# Patient Record
Sex: Female | Born: 1985
Health system: Southern US, Community
[De-identification: ages and names within clinical notes are randomized; demographics above are authoritative.]

## PROBLEM LIST (undated history)

## (undated) ENCOUNTER — Inpatient Hospital Stay (HOSPITAL_COMMUNITY): Payer: Self-pay

## (undated) DIAGNOSIS — Z889 Allergy status to unspecified drugs, medicaments and biological substances status: Secondary | ICD-10-CM

## (undated) DIAGNOSIS — G43909 Migraine, unspecified, not intractable, without status migrainosus: Secondary | ICD-10-CM

## (undated) DIAGNOSIS — I10 Essential (primary) hypertension: Secondary | ICD-10-CM

## (undated) DIAGNOSIS — Z789 Other specified health status: Secondary | ICD-10-CM

## (undated) DIAGNOSIS — F419 Anxiety disorder, unspecified: Secondary | ICD-10-CM

## (undated) DIAGNOSIS — R51 Headache: Secondary | ICD-10-CM

## (undated) HISTORY — DX: Allergy status to unspecified drugs, medicaments and biological substances: Z88.9

## (undated) HISTORY — DX: Anxiety disorder, unspecified: F41.9

## (undated) HISTORY — PX: NO PAST SURGERIES: SHX2092

## (undated) HISTORY — PX: COLONOSCOPY: SHX174

## (undated) HISTORY — PX: UPPER GI ENDOSCOPY: SHX6162

## (undated) HISTORY — DX: Headache: R51

---

## 2007-12-03 ENCOUNTER — Emergency Department (HOSPITAL_COMMUNITY): Admission: EM | Admit: 2007-12-03 | Discharge: 2007-12-03 | Payer: Self-pay | Admitting: Family Medicine

## 2007-12-11 ENCOUNTER — Emergency Department (HOSPITAL_COMMUNITY): Admission: EM | Admit: 2007-12-11 | Discharge: 2007-12-11 | Payer: Self-pay | Admitting: Emergency Medicine

## 2008-01-04 ENCOUNTER — Emergency Department (HOSPITAL_COMMUNITY): Admission: EM | Admit: 2008-01-04 | Discharge: 2008-01-04 | Payer: Self-pay | Admitting: Family Medicine

## 2008-06-01 ENCOUNTER — Emergency Department (HOSPITAL_COMMUNITY): Admission: EM | Admit: 2008-06-01 | Discharge: 2008-06-01 | Payer: Self-pay | Admitting: Emergency Medicine

## 2009-06-20 ENCOUNTER — Emergency Department (HOSPITAL_COMMUNITY): Admission: EM | Admit: 2009-06-20 | Discharge: 2009-06-20 | Payer: Self-pay | Admitting: Emergency Medicine

## 2010-05-05 ENCOUNTER — Ambulatory Visit: Payer: Self-pay | Admitting: Family Medicine

## 2010-05-05 DIAGNOSIS — K2970 Gastritis, unspecified, without bleeding: Secondary | ICD-10-CM | POA: Insufficient documentation

## 2010-05-05 DIAGNOSIS — K2990 Gastroduodenitis, unspecified, without bleeding: Secondary | ICD-10-CM

## 2010-12-11 NOTE — Assessment & Plan Note (Signed)
Summary: UPSET STOMACH/EVM   Vital Signs:  Patient Profile:   25 Years Old Female CC:      Stomach Pain Height:     64 inches Weight:      181.5 pounds Temp:     97.7 degrees F oral Pulse rate:   64 / minute Pulse (ortho):   18 / minute BP sitting:   104 / 70  (left arm)  Pt. in pain?   yes    Location:   abdomen    Intensity:   7    Type:       aching  Vitals Entered By: Ma Hillock LPNj              Is Patient Diabetic? No  Does patient need assistance? Functional Status Self care Ambulation Normal      Current Allergies: No known allergies History of Present Illness Chief Complaint: Stomach Pain History of Present Illness: She ate acid-food and feels like this set off "gastritis" as she has had in past. She has had no ulcers. 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  She did not get relief from OTC meds.  REVIEW OF SYSTEMS Constitutional Symptoms      Denies fever, chills, night sweats, weight loss, weight gain, and fatigue.  Eyes       Denies change in vision, eye pain, eye discharge, glasses, contact lenses, and eye surgery. Ear/Nose/Throat/Mouth       Denies hearing loss/aids, change in hearing, ear pain, ear discharge, dizziness, frequent runny nose, frequent nose bleeds, sinus problems, sore throat, hoarseness, and tooth pain or bleeding.  Respiratory       Denies dry cough, productive cough, wheezing, shortness of breath, asthma, bronchitis, and emphysema/COPD.  Cardiovascular       Denies murmurs, chest pain, and tires easily with exhertion.    Gastrointestinal       Complains of stomach pain and indigestion.      Denies nausea/vomiting, diarrhea, constipation, and blood in bowel movements. Genitourniary       Denies painful urination, kidney stones, and loss of urinary control. Neurological       Denies paralysis, seizures, and fainting/blackouts. Musculoskeletal       Denies muscle pain, joint pain, joint stiffness, decreased range of motion, redness,  swelling, muscle weakness, and gout.  Skin       Denies bruising, unusual mles/lumps or sores, and hair/skin or nail changes.  Psych       Denies mood changes, temper/anger issues, anxiety/stress, speech problems, depression, and sleep problems.  Family History: Father: Healthy Mother: Healthy Siblings: none  Social History: Marital Status:single Children: none Occupation: Consulting civil engineer Never Smoked Alcohol use-yes, rare Drug use-no Smoking Status:  never Drug Use:  no Physical Exam General appearance: well developed, well nourished, no acute distress Head: normocephalic, atraumatic Eyes: conjunctivae and lids normal Pupils: equal, round, reactive to light Oral/Pharynx: tongue normal, posterior pharynx without erythema or exudate Neck: neck supple,  trachea midline, no masses Chest/Lungs: no rales, wheezes, or rhonchi bilateral, breath sounds equal without effort Heart: regular rate and  rhythm, no murmur Abdomen: soft, non-tender without obvious organomegaly Extremities: normal extremities Neurological: grossly intact and non-focal Skin: no obvious rashes or lesions. Many tatooes MSE: oriented to time, place, and person Assessment New Problems: GASTRITIS (ICD-535.50)   Plan New Medications/Changes: PROTONIX 40 MG TBEC (PANTOPRAZOLE SODIUM) 1 po qd  #30 x 3, 05/05/2010, Kathrynn Running MD  New Orders: New Patient Level II [phone]  The  patient and/or caregiver has been counseled thoroughly with regard to medications prescribed including dosage, schedule, interactions, rationale for use, and possible side effects and they verbalize understanding.  Diagnoses and expected course of recovery discussed and will return if not improved as expected or if the condition worsens. Patient and/or caregiver verbalized understanding.  Prescriptions: PROTONIX 40 MG TBEC (PANTOPRAZOLE SODIUM) 1 po qd  #30 x 3   Entered and Authorized by:   Kathrynn Running MD   Signed by:   Kathrynn Running MD on  05/05/2010   Method used:   Electronically to        [facility]  [address] (retail)       [address], [address]       [place], [place]  [address]       Ph: [phone]       Fax: [phone]   RxID:   [id number]   Patient Instructions: 1)  Avoid acid foods, try to reduce colas.  Orders Added: 1)  New Patient Level II [99202]   The patient was informed that there is no on-call provider or services available at this clinic during off-hours (when the clinic is closed).  If the patient developed a problem or concern that required immediate attention, the patient was advised to go the the nearest available urgent care or emergency department for medical care.  The patient verbalized understanding.     It was clearly explained to the patient that this [facility] is not intended to be a primary care clinic.  The patient is always better served by the continuity of care and the provider/patient relationships developed with their dedicated primary care provider.  The patient was told to be sure to follow up as soon as possible with their primary care provider to discuss treatments received and to receive further examination and testing.  The patient verbalized understanding. The will f/u with PCP ASAP.   The risks, benefits and possible side effects were clearly explained and discussed with the patient.  The patient verbalized clear understanding.  The patient was given instructions to return if symptoms don't improve, worsen or new changes develop.  If it is not during clinic hours and the patient cannot get back to this clinic then the patient was told to seek medical care at an available urgent care or emergency department.  The patient verbalized understanding.    I have reviewed the above medical office visit documention, including diagnoses, history, medications, clinical lists, orders and plan of care.   Rodney Langton, MD, FAAFP  May 07, 2010

## 2011-02-16 LAB — POCT URINALYSIS DIP (DEVICE)
Glucose, UA: 250 mg/dL — AB
Ketones, ur: 15 mg/dL — AB
Nitrite: POSITIVE — AB
Protein, ur: >=300 mg/dL — AB
Specific Gravity, Urine: <=1.005 (ref 1.005–1.030)
Urobilinogen, UA: >=8 mg/dL (ref 0.0–1.0)
pH: 5 (ref 5.0–8.0)

## 2011-02-16 LAB — URINE CULTURE: Colony Count: >=100000

## 2011-02-16 LAB — POCT PREGNANCY, URINE: Preg Test, Ur: NEGATIVE

## 2011-07-18 ENCOUNTER — Inpatient Hospital Stay (HOSPITAL_COMMUNITY)
Admission: RE | Admit: 2011-07-18 | Discharge: 2011-07-18 | Disposition: A | Discharge status: Home / Self Care | Payer: 59 | Source: Ambulatory Visit | Attending: Family Medicine | Admitting: Family Medicine

## 2011-08-02 LAB — CULTURE, ROUTINE-ABSCESS

## 2011-08-09 LAB — POCT URINALYSIS DIP (DEVICE)
Bilirubin Urine: NEGATIVE
Glucose, UA: NEGATIVE
Ketones, ur: NEGATIVE
Nitrite: POSITIVE — AB
Operator id: 239701
Protein, ur: NEGATIVE
Specific Gravity, Urine: 1.01
Urobilinogen, UA: 0.2
pH: 6

## 2011-08-09 LAB — POCT PREGNANCY, URINE
Operator id: 239701
Preg Test, Ur: NEGATIVE

## 2012-05-14 ENCOUNTER — Emergency Department: Payer: Self-pay | Admitting: Emergency Medicine

## 2013-02-11 LAB — OB RESULTS CONSOLE RPR: RPR: NONREACTIVE

## 2013-03-01 LAB — OB RESULTS CONSOLE GC/CHLAMYDIA
Chlamydia: NEGATIVE
Gonorrhea: NEGATIVE

## 2013-04-04 ENCOUNTER — Inpatient Hospital Stay (HOSPITAL_COMMUNITY)
Admission: AD | Admit: 2013-04-04 | Discharge: 2013-04-04 | Disposition: A | Discharge status: Home / Self Care | Payer: Medicaid Other | Source: Ambulatory Visit | Attending: Obstetrics and Gynecology | Admitting: Obstetrics and Gynecology

## 2013-04-04 ENCOUNTER — Encounter (HOSPITAL_COMMUNITY): Payer: Self-pay | Admitting: Obstetrics and Gynecology

## 2013-04-04 DIAGNOSIS — O368130 Decreased fetal movements, third trimester, not applicable or unspecified: Secondary | ICD-10-CM

## 2013-04-04 DIAGNOSIS — O368190 Decreased fetal movements, unspecified trimester, not applicable or unspecified: Secondary | ICD-10-CM | POA: Insufficient documentation

## 2013-04-04 HISTORY — DX: Other specified health status: Z78.9

## 2013-04-04 NOTE — MAU Note (Signed)
[name] is here today with complaints of decreased fetal movement. She is 35w and has only felt 2 movements in the last 24 hours.

## 2013-04-27 ENCOUNTER — Encounter (HOSPITAL_COMMUNITY): Payer: Self-pay | Admitting: *Deleted

## 2013-04-27 ENCOUNTER — Telehealth (HOSPITAL_COMMUNITY): Payer: Self-pay | Admitting: *Deleted

## 2013-04-27 NOTE — Telephone Encounter (Signed)
Preadmission screen  

## 2013-04-29 ENCOUNTER — Other Ambulatory Visit: Payer: Self-pay | Admitting: Obstetrics and Gynecology

## 2013-04-29 NOTE — H&P (Addendum)
Admission History and Physical Exam for an Obstetrics Patient  [name] is a 27 y.o. female, G2P0010, at [redacted]w[redacted]d gestation, who presents for a primary cesarean section. She has been followed at the [facility] and Gynecology division of [facility].  Her pregnancy has been complicated by obesity (weight is 222 pounds closed).  The patient desires a primary cesarean delivery despite multiple health care providers who have all advised that she not. See history below.  OB History   Grav Para Term Preterm Abortions TAB SAB Ect Mult Living   2 0   1 1    0      Past Medical History  Diagnosis Date  . Medical history non-contributory   . Headache(784.0)   . H/O seasonal allergies     No prescriptions prior to admission    Past Surgical History  Procedure Laterality Date  . No past surgeries      No Known Allergies  Family History: family history is not on file.  Social History:  reports that she has never smoked. She does not have any smokeless tobacco history on file. She reports that she does not drink alcohol or use illicit drugs.  Review of systems: Normal pregnancy complaints.  Admission Physical Exam:    There is no weight on file to calculate BMI.  There were no vitals taken for this visit.  HEENT:                 Within normal limits Chest:                   Clear Heart:                    Regular rate and rhythm Abdomen:             Gravid and nontender Extremities:          Grossly normal Neurologic exam: Grossly normal Pelvic exam:         Cervix: closed  Prenatal labs: ABO, Rh:              O positive HBsAg:                   negative HIV:                         nonreactive GBS:                      positive Antibody:               negative Rubella:                 positive RPR:                    Nonreactive (04/03 0000)      Prenatal Transfer Tool  Maternal Diabetes: No Genetic Screening: late entry into  prenatal care Maternal Ultrasounds/Referrals: Normal Fetal Ultrasounds or other Referrals:  None Maternal Substance Abuse:  No Significant Maternal Medications:  None Significant Maternal Lab Results:  None Other Comments:  positive beta strep  Assessment:  [redacted]w[redacted]d gestation  Obesity  Positive beta strep  Late entry into prenatal care  Desires primary cesarean delivery  Plan:  The patient will undergo a primary low transverse cesarean section.  Multiple health care providers have tried to convince the patient not to have this done.  The risk associated with the procedure have been reviewed including anesthetic complications, bleeding, possible damage to the surrounding organs, infections, and the complications for future pregnancies which may include adhesion formation, uterine rupture, fetal damage, and abnormal placentation which may lead to hysterectomy.  The patient insists that we proceed with cesarean delivery.   Janine Limbo 04/29/2013, 8:21 PM

## 2013-04-30 ENCOUNTER — Inpatient Hospital Stay (HOSPITAL_COMMUNITY): Payer: BC Managed Care – PPO | Admitting: Anesthesiology

## 2013-04-30 ENCOUNTER — Encounter (HOSPITAL_COMMUNITY): Payer: Self-pay | Admitting: Anesthesiology

## 2013-04-30 ENCOUNTER — Inpatient Hospital Stay (HOSPITAL_COMMUNITY)
Admission: RE | Admit: 2013-04-30 | Discharge: 2013-05-03 | DRG: 371 | Disposition: A | Discharge status: Home / Self Care | Payer: BC Managed Care – PPO | Source: Ambulatory Visit | Attending: Obstetrics and Gynecology | Admitting: Obstetrics and Gynecology

## 2013-04-30 ENCOUNTER — Encounter (HOSPITAL_COMMUNITY)
Admission: RE | Disposition: A | Discharge status: Home / Self Care | Payer: Self-pay | Source: Ambulatory Visit | Attending: Obstetrics and Gynecology

## 2013-04-30 ENCOUNTER — Encounter (HOSPITAL_COMMUNITY): Payer: Self-pay | Admitting: Pharmacy Technician

## 2013-04-30 DIAGNOSIS — O99892 Other specified diseases and conditions complicating childbirth: Principal | ICD-10-CM | POA: Diagnosis present

## 2013-04-30 DIAGNOSIS — O9902 Anemia complicating childbirth: Secondary | ICD-10-CM | POA: Diagnosis present

## 2013-04-30 DIAGNOSIS — D6489 Other specified anemias: Secondary | ICD-10-CM | POA: Diagnosis present

## 2013-04-30 DIAGNOSIS — Z22330 Carrier of Group B streptococcus: Secondary | ICD-10-CM

## 2013-04-30 DIAGNOSIS — O99214 Obesity complicating childbirth: Secondary | ICD-10-CM | POA: Diagnosis present

## 2013-04-30 DIAGNOSIS — Z98891 History of uterine scar from previous surgery: Secondary | ICD-10-CM

## 2013-04-30 DIAGNOSIS — E669 Obesity, unspecified: Secondary | ICD-10-CM | POA: Diagnosis present

## 2013-04-30 LAB — TYPE AND SCREEN
ABO/RH(D): O POS
Antibody Screen: NEGATIVE

## 2013-04-30 LAB — CBC
HCT: 32.7 % — ABNORMAL LOW (ref 36.0–46.0)
Hemoglobin: 11.1 g/dL — ABNORMAL LOW (ref 12.0–15.0)
MCH: 29.1 pg (ref 26.0–34.0)
MCHC: 33.9 g/dL (ref 30.0–36.0)
MCV: 85.6 fL (ref 78.0–100.0)
Platelets: 215 10*3/uL (ref 150–400)
RBC: 3.82 MIL/uL — ABNORMAL LOW (ref 3.87–5.11)
RDW: 14.3 % (ref 11.5–15.5)
WBC: 8.7 10*3/uL (ref 4.0–10.5)

## 2013-04-30 LAB — SURGICAL PCR SCREEN
MRSA, PCR: NEGATIVE
Staphylococcus aureus: NEGATIVE

## 2013-04-30 LAB — RPR: RPR Ser Ql: NONREACTIVE

## 2013-04-30 LAB — ABO/RH: ABO/RH(D): O POS

## 2013-04-30 SURGERY — Surgical Case
Anesthesia: Spinal | Site: Abdomen | Wound class: Clean Contaminated

## 2013-04-30 MED ORDER — SODIUM CHLORIDE 0.9 % IJ SOLN: 3.0000 mL | INTRAMUSCULAR | Status: DC | PRN

## 2013-04-30 MED ORDER — OXYCODONE-ACETAMINOPHEN 5-325 MG PO TABS
1.0000 | ORAL_TABLET | ORAL | Status: DC | PRN
  Administered 2013-05-01: 1 via ORAL
  Administered 2013-05-02 (×3): 2 via ORAL
  Administered 2013-05-03 (×2): 1 via ORAL
  Filled 2013-04-30: qty 2
  Filled 2013-04-30 (×2): qty 1
  Filled 2013-04-30: qty 2
  Filled 2013-04-30: qty 1
  Filled 2013-04-30: qty 2
  Filled 2013-04-30: qty 1
  Filled 2013-04-30: qty 2

## 2013-04-30 MED ORDER — MEPERIDINE HCL 25 MG/ML IJ SOLN: 6.2500 mg | INTRAMUSCULAR | Status: DC | PRN

## 2013-04-30 MED ORDER — ONDANSETRON HCL 4 MG PO TABS: 4.0000 mg | ORAL_TABLET | ORAL | Status: DC | PRN

## 2013-04-30 MED ORDER — MORPHINE SULFATE (PF) 0.5 MG/ML IJ SOLN
INTRAMUSCULAR | Status: DC | PRN
  Administered 2013-04-30: .2 mg via INTRATHECAL

## 2013-04-30 MED ORDER — SCOPOLAMINE 1 MG/3DAYS TD PT72
MEDICATED_PATCH | TRANSDERMAL | Status: AC
  Administered 2013-04-30: 1.5 mg via TRANSDERMAL
  Filled 2013-04-30: qty 1

## 2013-04-30 MED ORDER — PHENYLEPHRINE HCL 10 MG/ML IJ SOLN
INTRAMUSCULAR | Status: DC | PRN
  Administered 2013-04-30: 80 ug via INTRAVENOUS
  Administered 2013-04-30: 40 ug via INTRAVENOUS
  Administered 2013-04-30 (×4): 80 ug via INTRAVENOUS
  Administered 2013-04-30 (×3): 40 ug via INTRAVENOUS
  Administered 2013-04-30 (×2): 80 ug via INTRAVENOUS
  Administered 2013-04-30: 40 ug via INTRAVENOUS

## 2013-04-30 MED ORDER — CALCIUM CARBONATE ANTACID 500 MG PO CHEW: 2.0000 | CHEWABLE_TABLET | Freq: Every day | ORAL | Status: DC | PRN

## 2013-04-30 MED ORDER — SCOPOLAMINE 1 MG/3DAYS TD PT72: 1.0000 | MEDICATED_PATCH | Freq: Once | TRANSDERMAL | Status: DC

## 2013-04-30 MED ORDER — TETANUS-DIPHTH-ACELL PERTUSSIS 5-2.5-18.5 LF-MCG/0.5 IM SUSP
0.5000 mL | Freq: Once | INTRAMUSCULAR | Status: AC
  Administered 2013-05-01: 0.5 mL via INTRAMUSCULAR
  Filled 2013-04-30: qty 0.5

## 2013-04-30 MED ORDER — NALOXONE HCL 1 MG/ML IJ SOLN: 1.0000 ug/kg/h | INTRAVENOUS | Status: DC | PRN

## 2013-04-30 MED ORDER — NALBUPHINE HCL 10 MG/ML IJ SOLN: 5.0000 mg | INTRAMUSCULAR | Status: DC | PRN

## 2013-04-30 MED ORDER — MEASLES, MUMPS & RUBELLA VAC ~~LOC~~ INJ: 0.5000 mL | INJECTION | Freq: Once | SUBCUTANEOUS | Status: DC

## 2013-04-30 MED ORDER — PHENYLEPHRINE 40 MCG/ML (10ML) SYRINGE FOR IV PUSH (FOR BLOOD PRESSURE SUPPORT)
PREFILLED_SYRINGE | INTRAVENOUS | Status: AC
  Filled 2013-04-30: qty 20

## 2013-04-30 MED ORDER — DIBUCAINE 1 % RE OINT: 1.0000 "application " | TOPICAL_OINTMENT | RECTAL | Status: DC | PRN

## 2013-04-30 MED ORDER — DIPHENHYDRAMINE HCL 25 MG PO CAPS: 25.0000 mg | ORAL_CAPSULE | Freq: Four times a day (QID) | ORAL | Status: DC | PRN

## 2013-04-30 MED ORDER — KETOROLAC TROMETHAMINE 30 MG/ML IJ SOLN
15.0000 mg | Freq: Once | INTRAMUSCULAR | Status: DC | PRN
Start: 2013-04-30 — End: 2013-04-30

## 2013-04-30 MED ORDER — ZOLPIDEM TARTRATE 5 MG PO TABS: 5.0000 mg | ORAL_TABLET | Freq: Every evening | ORAL | Status: DC | PRN

## 2013-04-30 MED ORDER — ONDANSETRON HCL 4 MG/2ML IJ SOLN: 4.0000 mg | Freq: Three times a day (TID) | INTRAMUSCULAR | Status: DC | PRN

## 2013-04-30 MED ORDER — WITCH HAZEL-GLYCERIN EX PADS: 1.0000 "application " | MEDICATED_PAD | CUTANEOUS | Status: DC | PRN

## 2013-04-30 MED ORDER — PROMETHAZINE HCL 25 MG/ML IJ SOLN: 6.2500 mg | INTRAMUSCULAR | Status: DC | PRN

## 2013-04-30 MED ORDER — BUPIVACAINE-EPINEPHRINE (PF) 0.5% -1:200000 IJ SOLN
INTRAMUSCULAR | Status: AC
  Filled 2013-04-30: qty 10

## 2013-04-30 MED ORDER — FENTANYL CITRATE 0.05 MG/ML IJ SOLN
INTRAMUSCULAR | Status: AC
  Filled 2013-04-30: qty 2

## 2013-04-30 MED ORDER — MENTHOL 3 MG MT LOZG: 1.0000 | LOZENGE | OROMUCOSAL | Status: DC | PRN

## 2013-04-30 MED ORDER — OXYTOCIN 40 UNITS IN LACTATED RINGERS INFUSION - SIMPLE MED: 62.5000 mL/h | INTRAVENOUS | Status: AC

## 2013-04-30 MED ORDER — CEFAZOLIN SODIUM-DEXTROSE 2-3 GM-% IV SOLR
INTRAVENOUS | Status: AC
  Filled 2013-04-30: qty 50

## 2013-04-30 MED ORDER — LACTATED RINGERS IV SOLN
INTRAVENOUS | Status: DC | PRN
  Administered 2013-04-30 (×2): via INTRAVENOUS

## 2013-04-30 MED ORDER — METOCLOPRAMIDE HCL 5 MG/ML IJ SOLN: 10.0000 mg | Freq: Three times a day (TID) | INTRAMUSCULAR | Status: DC | PRN

## 2013-04-30 MED ORDER — PRENATAL MULTIVITAMIN CH
1.0000 | ORAL_TABLET | Freq: Every day | ORAL | Status: DC
  Administered 2013-05-01 – 2013-05-02 (×2): 1 via ORAL
  Filled 2013-04-30 (×2): qty 1

## 2013-04-30 MED ORDER — KETOROLAC TROMETHAMINE 60 MG/2ML IM SOLN: 60.0000 mg | Freq: Once | INTRAMUSCULAR | Status: AC | PRN

## 2013-04-30 MED ORDER — KETOROLAC TROMETHAMINE 60 MG/2ML IM SOLN
INTRAMUSCULAR | Status: AC
  Administered 2013-04-30: 60 mg via INTRAMUSCULAR
  Filled 2013-04-30: qty 2

## 2013-04-30 MED ORDER — BUPIVACAINE-EPINEPHRINE 0.5% -1:200000 IJ SOLN
INTRAMUSCULAR | Status: DC | PRN
  Administered 2013-04-30: 10 mL

## 2013-04-30 MED ORDER — OXYTOCIN 10 UNIT/ML IJ SOLN
INTRAMUSCULAR | Status: AC
  Filled 2013-04-30: qty 4

## 2013-04-30 MED ORDER — KETOROLAC TROMETHAMINE 30 MG/ML IJ SOLN: 30.0000 mg | Freq: Four times a day (QID) | INTRAMUSCULAR | Status: AC | PRN

## 2013-04-30 MED ORDER — FENTANYL CITRATE 0.05 MG/ML IJ SOLN
INTRAMUSCULAR | Status: DC | PRN
  Administered 2013-04-30: 12.5 ug via INTRATHECAL

## 2013-04-30 MED ORDER — MORPHINE SULFATE 0.5 MG/ML IJ SOLN
INTRAMUSCULAR | Status: AC
  Filled 2013-04-30: qty 10

## 2013-04-30 MED ORDER — HYDROMORPHONE HCL PF 1 MG/ML IJ SOLN: 0.2500 mg | INTRAMUSCULAR | Status: DC | PRN

## 2013-04-30 MED ORDER — ONDANSETRON HCL 4 MG/2ML IJ SOLN
INTRAMUSCULAR | Status: DC | PRN
  Administered 2013-04-30: 4 mg via INTRAVENOUS

## 2013-04-30 MED ORDER — LACTATED RINGERS IV SOLN
Freq: Once | INTRAVENOUS | Status: AC
  Administered 2013-04-30: 13:00:00 via INTRAVENOUS

## 2013-04-30 MED ORDER — ONDANSETRON HCL 4 MG/2ML IJ SOLN: 4.0000 mg | INTRAMUSCULAR | Status: DC | PRN

## 2013-04-30 MED ORDER — SIMETHICONE 80 MG PO CHEW: 80.0000 mg | CHEWABLE_TABLET | ORAL | Status: DC | PRN

## 2013-04-30 MED ORDER — OXYTOCIN 10 UNIT/ML IJ SOLN
40.0000 [IU] | INTRAVENOUS | Status: DC | PRN
  Administered 2013-04-30: 40 [IU] via INTRAVENOUS

## 2013-04-30 MED ORDER — ONDANSETRON HCL 4 MG/2ML IJ SOLN
INTRAMUSCULAR | Status: AC
  Filled 2013-04-30: qty 2

## 2013-04-30 MED ORDER — HYDROMORPHONE HCL PF 1 MG/ML IJ SOLN
INTRAMUSCULAR | Status: AC
  Administered 2013-04-30: 0.5 mg via INTRAVENOUS
  Filled 2013-04-30: qty 1

## 2013-04-30 MED ORDER — DIPHENHYDRAMINE HCL 50 MG/ML IJ SOLN: 25.0000 mg | INTRAMUSCULAR | Status: DC | PRN

## 2013-04-30 MED ORDER — DIPHENHYDRAMINE HCL 25 MG PO CAPS: 25.0000 mg | ORAL_CAPSULE | ORAL | Status: DC | PRN

## 2013-04-30 MED ORDER — IBUPROFEN 600 MG PO TABS
600.0000 mg | ORAL_TABLET | Freq: Four times a day (QID) | ORAL | Status: DC
  Administered 2013-05-01 – 2013-05-03 (×8): 600 mg via ORAL
  Filled 2013-04-30 (×9): qty 1

## 2013-04-30 MED ORDER — LANOLIN HYDROUS EX OINT: 1.0000 "application " | TOPICAL_OINTMENT | CUTANEOUS | Status: DC | PRN

## 2013-04-30 MED ORDER — PRENATAL MULTIVITAMIN CH: 1.0000 | ORAL_TABLET | Freq: Every day | ORAL | Status: DC

## 2013-04-30 MED ORDER — BUPIVACAINE IN DEXTROSE 0.75-8.25 % IT SOLN
INTRATHECAL | Status: DC | PRN
  Administered 2013-04-30: 1.4 mL via INTRATHECAL

## 2013-04-30 MED ORDER — DIPHENHYDRAMINE HCL 50 MG/ML IJ SOLN: 12.5000 mg | INTRAMUSCULAR | Status: DC | PRN

## 2013-04-30 MED ORDER — NALOXONE HCL 0.4 MG/ML IJ SOLN: 0.4000 mg | INTRAMUSCULAR | Status: DC | PRN

## 2013-04-30 MED ORDER — LACTATED RINGERS IV SOLN
INTRAVENOUS | Status: DC | PRN
  Administered 2013-04-30: 15:00:00 via INTRAVENOUS

## 2013-04-30 MED ORDER — SIMETHICONE 80 MG PO CHEW
80.0000 mg | CHEWABLE_TABLET | Freq: Three times a day (TID) | ORAL | Status: DC
  Administered 2013-04-30 – 2013-05-02 (×8): 80 mg via ORAL

## 2013-04-30 MED ORDER — MEDROXYPROGESTERONE ACETATE 150 MG/ML IM SUSP: 150.0000 mg | INTRAMUSCULAR | Status: DC | PRN

## 2013-04-30 MED ORDER — LACTATED RINGERS IV SOLN
INTRAVENOUS | Status: DC
  Administered 2013-05-01: 02:00:00 via INTRAVENOUS

## 2013-04-30 MED ORDER — CEFAZOLIN SODIUM-DEXTROSE 2-3 GM-% IV SOLR
2.0000 g | INTRAVENOUS | Status: AC
Start: 2013-04-30 — End: 2013-04-30
  Administered 2013-04-30: 2 g via INTRAVENOUS
  Filled 2013-04-30: qty 50

## 2013-04-30 MED ORDER — SENNOSIDES-DOCUSATE SODIUM 8.6-50 MG PO TABS
2.0000 | ORAL_TABLET | Freq: Every day | ORAL | Status: DC
  Administered 2013-04-30 – 2013-05-02 (×3): 2 via ORAL

## 2013-04-30 SURGICAL SUPPLY — 42 items
CLAMP CORD UMBIL (MISCELLANEOUS) IMPLANT
CLOTH BEACON ORANGE TIMEOUT ST (SAFETY) ×2 IMPLANT
CONTAINER PREFILL 10% NBF 15ML (MISCELLANEOUS) IMPLANT
DRAIN JACKSON PRT FLT 7MM (DRAIN) IMPLANT
DRAPE LG THREE QUARTER DISP (DRAPES) ×2 IMPLANT
DRESSING TELFA 8X3 (GAUZE/BANDAGES/DRESSINGS) ×2 IMPLANT
DRSG OPSITE POSTOP 4X10 (GAUZE/BANDAGES/DRESSINGS) ×2 IMPLANT
DURAPREP 26ML APPLICATOR (WOUND CARE) ×4 IMPLANT
ELECT REM PT RETURN 9FT ADLT (ELECTROSURGICAL) ×2
ELECTRODE REM PT RTRN 9FT ADLT (ELECTROSURGICAL) ×1 IMPLANT
EVACUATOR SILICONE 100CC (DRAIN) IMPLANT
EXTRACTOR VACUUM M CUP 4 TUBE (SUCTIONS) IMPLANT
GLOVE BIOGEL PI IND STRL 8.5 (GLOVE) ×1 IMPLANT
GLOVE BIOGEL PI INDICATOR 8.5 (GLOVE) ×1
GLOVE ECLIPSE 8.0 STRL XLNG CF (GLOVE) ×4 IMPLANT
GOWN PREVENTION PLUS XXLARGE (GOWN DISPOSABLE) ×2 IMPLANT
GOWN STRL REIN XL XLG (GOWN DISPOSABLE) ×4 IMPLANT
KIT ABG SYR 3ML LUER SLIP (SYRINGE) IMPLANT
NEEDLE HYPO 25X1 1.5 SAFETY (NEEDLE) ×2 IMPLANT
NEEDLE HYPO 25X5/8 SAFETYGLIDE (NEEDLE) IMPLANT
PACK C SECTION WH (CUSTOM PROCEDURE TRAY) ×2 IMPLANT
PAD ABD 7.5X8 STRL (GAUZE/BANDAGES/DRESSINGS) ×2 IMPLANT
PAD OB MATERNITY 4.3X12.25 (PERSONAL CARE ITEMS) ×2 IMPLANT
RINGERS IRRIG 1000ML POUR BTL (IV SOLUTION) ×2 IMPLANT
SPONGE GAUZE 4X4 12PLY (GAUZE/BANDAGES/DRESSINGS) ×2 IMPLANT
STAPLER VISISTAT 35W (STAPLE) IMPLANT
SUT MNCRL AB 3-0 PS2 27 (SUTURE) ×2 IMPLANT
SUT PLAIN 0 NONE (SUTURE) IMPLANT
SUT PLAIN 2 0 XLH (SUTURE) ×2 IMPLANT
SUT SILK 3 0 FS 1X18 (SUTURE) IMPLANT
SUT VIC AB 0 CT1 27 (SUTURE) ×2
SUT VIC AB 0 CT1 27XBRD ANBCTR (SUTURE) ×2 IMPLANT
SUT VIC AB 2-0 CTX 36 (SUTURE) ×4 IMPLANT
SUT VIC AB 3-0 CT1 27 (SUTURE)
SUT VIC AB 3-0 CT1 TAPERPNT 27 (SUTURE) IMPLANT
SUT VIC AB 3-0 SH 27 (SUTURE)
SUT VIC AB 3-0 SH 27X BRD (SUTURE) IMPLANT
SYR CONTROL 10ML LL (SYRINGE) ×2 IMPLANT
TAPE CLOTH SURG 4X10 WHT LF (GAUZE/BANDAGES/DRESSINGS) ×2 IMPLANT
TOWEL OR 17X24 6PK STRL BLUE (TOWEL DISPOSABLE) ×6 IMPLANT
TRAY FOLEY CATH 14FR (SET/KITS/TRAYS/PACK) ×2 IMPLANT
WATER STERILE IRR 1000ML POUR (IV SOLUTION) ×2 IMPLANT

## 2013-04-30 NOTE — Progress Notes (Signed)
Patient was set up with a double electric pump. She pumped for 10 minutes at 1830.

## 2013-04-30 NOTE — H&P (Signed)
The patient was interviewed and examined today.  The previously documented history and physical examination was reviewed. There are no changes. The operative procedure was reviewed. The risks and benefits were outlined again. The specific risks include, but are not limited to, anesthetic complications, bleeding, infections, and possible damage to the surrounding organs. The patient's questions were answered.  We are ready to proceed as outlined. The likelihood of the patient achieving the goals of this procedure is very likely.   CBC    Component Value Date/Time   WBC 8.7 04/30/2013 1220   RBC 3.82* 04/30/2013 1220   HGB 11.1* 04/30/2013 1220   HCT 32.7* 04/30/2013 1220   PLT 215 04/30/2013 1220   MCV 85.6 04/30/2013 1220   MCH 29.1 04/30/2013 1220   MCHC 33.9 04/30/2013 1220   RDW 14.3 04/30/2013 1220    BP 116/58  Pulse 73  Temp(Src) 97.9 F (36.6 C) (Oral)  Resp 18  Ht 5\' 4"  (1.626 m)  Wt 216 lb (97.977 kg)  BMI 37.06 kg/m2   Leonard Schwartz, M.D.

## 2013-04-30 NOTE — Anesthesia Preprocedure Evaluation (Signed)
Anesthesia Evaluation  Patient identified by MRN, date of birth, ID band Patient awake    Reviewed: Allergy & Precautions, H&P , NPO status , Patient's Chart, lab work & pertinent test results  Airway Mallampati: II TM Distance: >3 FB Neck ROM: full    Dental no notable dental hx.    Pulmonary neg pulmonary ROS,    Pulmonary exam normal       Cardiovascular negative cardio ROS      Neuro/Psych negative psych ROS   GI/Hepatic negative GI ROS, Neg liver ROS,   Endo/Other  negative endocrine ROS  Renal/GU negative Renal ROS  negative genitourinary   Musculoskeletal negative musculoskeletal ROS (+)   Abdominal Normal abdominal exam  (+)   Peds negative pediatric ROS (+)  Hematology negative hematology ROS (+)   Anesthesia Other Findings   Reproductive/Obstetrics (+) Pregnancy                           Anesthesia Physical Anesthesia Plan  ASA: II  Anesthesia Plan: Spinal   Post-op Pain Management:    Induction:   Airway Management Planned:   Additional Equipment:   Intra-op Plan:   Post-operative Plan:   Informed Consent: I have reviewed the patients History and Physical, chart, labs and discussed the procedure including the risks, benefits and alternatives for the proposed anesthesia with the patient or authorized representative who has indicated his/her understanding and acceptance.     Plan Discussed with: CRNA and Surgeon  Anesthesia Plan Comments:         Anesthesia Quick Evaluation  

## 2013-04-30 NOTE — Consult Note (Signed)
Neonatology Note:  Attendance at C-section:  I was asked by Dr. Stringer to attend this primary C/S at term at patient's request. The mother is a G2P0A1 O pos, GBS pos with obesity and late PNC. ROM at delivery, fluid clear. Infant vigorous with good spontaneous cry and tone. Needed only minimal bulb suctioning. Ap 9/9. Lungs clear to ausc in DR. To CN to care of Pediatrician.  Laynie Espy C. Elyce Zollinger, MD  

## 2013-04-30 NOTE — Anesthesia Procedure Notes (Signed)
Spinal  Patient location during procedure: OR Start time: 04/30/2013 2:19 PM End time: 04/30/2013 2:21 PM Preanesthetic Checklist Completed: patient identified, surgical consent, pre-op evaluation, timeout performed, IV checked, risks and benefits discussed and monitors and equipment checked Spinal Block Patient position: sitting Prep: DuraPrep Patient monitoring: heart rate, cardiac monitor, continuous pulse ox and blood pressure Approach: midline Location: L3-4 Injection technique: single-shot Needle Needle type: Pencan  Needle gauge: 24 G Needle length: 9 cm Needle insertion depth: 5 cm Assessment Sensory level: T4

## 2013-04-30 NOTE — Op Note (Addendum)
OPERATIVE NOTE  Patient's Name: [name]  Date of Birth: [birth year]  Medical Records Number: [id number]  Date of Operation: 04/30/2013  Preoperative diagnosis:  [redacted]w[redacted]d weeks gestation  Elective Cesarean Section  Obesity  Anemia  Postoperative diagnosis:  [redacted]w[redacted]d weeks gestation  Elective Cesarean Section  Obesity  Anemia  Procedure:  Primary low transverse cesarean section  Surgeon:  Leonard Schwartz, M.D.  Assistant:  Haroldine Laws, certified nurse midwife  Anesthesia:  Spinal  Disposition:  [name] is a 27 y.o. female, G2P1011, who presents at [redacted]w[redacted]d weeks gestation. The patient has been followed at the [facility] obstetrics and gynecology division of [facility] care for women. She has the above mentioned diagnosis. She elects cesarean delivery for no medical indication. She understands the indications for her procedure and she accepts the risk of, but not limited to, anesthetic complications, bleeding, infections, and possible damage to the surrounding organs.  Findings:  A  female Phineas Semen) was delivered from a OT position.  The Apgar scores were 9/9 . The uterus, fallopian tubes, and ovaries were normal for the gravid state.  Procedure:  The patient was taken to the operating room where a spinal anesthetic was given. The patient's abdomen was prepped with Chloraprep. The perineum was prepped with betadine. A Foley catheter was placed in the bladder. The patient was sterilely draped. A "timeout" was performed which properly identified the patient and the correct operative procedure. The lower abdomen was injected with half percent Marcaine with epinephrine. A low transverse incision was made in the abdomen and carried sharply through the subcutaneous tissue, the fascia, and the anterior peritoneum. An incision was made in the lower uterine segment. The incision was extended in a low transverse fashion. The membranes were ruptured.  The fetal head was delivered without difficulty. The mouth and nose were suctioned. The remainder of the infant was then delivered. The cord was clamped and cut. The infant was handed to the awaiting pediatric team. The placenta was removed. The uterine cavity was cleaned of amniotic fluid, clotted blood, and membranes. The uterine incision was closed using a running locking suture of 2-0 Vicryl. An imbricating suture of 2-0 Vicryl was placed. The pelvis was vigorously irrigated. Hemostasis was adequate. The anterior peritoneum and the abdominal musculature were closed using 2-0 Vicryl. The fascia was closed using a running suture of 0 Vicryl followed by 3 interrupted sutures of 0 Vicryl. The subcutaneous layer was closed using interrupted sutures of 2-0 Vicryl. The skin was reapproximated using a subcuticular suture of 3-0 Monocryl. Sponge, needle, and instrument counts were correct on 2 occasions. The estimated blood loss for the procedure was 800 cc. The patient tolerated her procedure well. She was transported to the recovery room in stable condition. The infant was taken to the full-term nursery in stable condition. The placenta was sent to labor and delivery.  Leonard Schwartz, M.D.

## 2013-04-30 NOTE — Transfer of Care (Signed)
Immediate Anesthesia Transfer of Care Note  Patient: [name]  Procedure(s) Performed: Procedure(s): CESAREAN SECTION (N/A)  Patient Location: PACU  Anesthesia Type:Spinal  Level of Consciousness: awake, alert  and oriented  Airway & Oxygen Therapy: Patient Spontanous Breathing  Post-op Assessment: Report given to PACU RN  Post vital signs: Reviewed  Complications: No apparent anesthesia complications

## 2013-04-30 NOTE — Anesthesia Postprocedure Evaluation (Signed)
Anesthesia Post Note  Patient: [name]  Procedure(s) Performed: Procedure(s) (LRB): CESAREAN SECTION (N/A)  Anesthesia type: Spinal  Patient location: PACU  Post pain: Pain level controlled  Post assessment: Post-op Vital signs reviewed  Last Vitals:  Filed Vitals:   04/30/13 1600  BP: 98/51  Pulse: 57  Temp:   Resp: 10    Post vital signs: Reviewed  Level of consciousness: awake  Complications: No apparent anesthesia complications

## 2013-05-01 LAB — CBC
HCT: 26.2 % — ABNORMAL LOW (ref 36.0–46.0)
Hemoglobin: 8.7 g/dL — ABNORMAL LOW (ref 12.0–15.0)
MCH: 28.9 pg (ref 26.0–34.0)
MCHC: 33.6 g/dL (ref 30.0–36.0)
MCV: 86.2 fL (ref 78.0–100.0)
Platelets: 168 10*3/uL (ref 150–400)
RBC: 3.04 MIL/uL — ABNORMAL LOW (ref 3.87–5.11)
RDW: 14.2 % (ref 11.5–15.5)
WBC: 9.8 10*3/uL (ref 4.0–10.5)

## 2013-05-01 MED ORDER — POLYSACCHARIDE IRON COMPLEX 150 MG PO CAPS
150.0000 mg | ORAL_CAPSULE | Freq: Two times a day (BID) | ORAL | Status: DC
  Administered 2013-05-01 – 2013-05-03 (×5): 150 mg via ORAL
  Filled 2013-05-01 (×7): qty 1

## 2013-05-01 NOTE — Anesthesia Postprocedure Evaluation (Signed)
  Anesthesia Post-op Note  Patient: [name]  Procedure(s) Performed: Procedure(s): CESAREAN SECTION (N/A)  Patient Location: Mother/Baby  Anesthesia Type:Spinal  Level of Consciousness: awake  Airway and Oxygen Therapy: Patient Spontanous Breathing  Post-op Pain: mild  Post-op Assessment: Patient's Cardiovascular Status Stable and Respiratory Function Stable  Post-op Vital Signs: stable  Complications: No apparent anesthesia complications

## 2013-05-01 NOTE — Progress Notes (Signed)
Patient urine output from 2015 to 0200 only 50cc of dark amber urine. LR with pitocin still running with about 40 cc remaining in bag. VSS, lochia scant, fundus firm. Patient not taking in much po fluids. Denny Levy CNM called. Okay to switch over to LR at 118ml/hr. Will continue to closely monitor patient.

## 2013-05-01 NOTE — Progress Notes (Addendum)
Subjective: Postpartum Day 1: Cesarean Delivery secondary to: elective at 39wks  Patient reports tolerating PO, + flatus and no problems voiding.  no complaints, up ad lib without syncope Pain well controlled with po meds BF started  Mood stable, bonding well Contraception: undecided at present   Objective: Vital signs in last 24 hours: Temp:  [97.4 F (36.3 C)-98.4 F (36.9 C)] 97.5 F (36.4 C) (06/21 1000) Pulse Rate:  [54-91] 66 (06/21 1002) Resp:  [10-25] 18 (06/21 1001) BP: (90-126)/(39-76) 126/64 mmHg (06/21 1002) SpO2:  [97 %-100 %] 98 % (06/21 1000) Weight:  [216 lb (97.977 kg)] 216 lb (97.977 kg) (06/20 1221)  Physical Exam:  General: alert and no distress Heart: RRR Lungs: CTAB Abdomen: BS x4 Uterine Fundus: firm Incision: pressure dressing remains on, CDI    Lochia: appropriate DVT Evaluation: No evidence of DVT seen on physical exam. Negative Homan's sign. Calf/Ankle edema is present.   Recent Labs  04/30/13 1220 05/01/13 0626  HGB 11.1* 8.7*  HCT 32.7* 26.2*    Assessment/Plan: Status post Cesarean section. Postoperative course complicated by mild anemia, with orthostatic changes  Will order FE supp BID  Continue current care.   Isidoro Santillana M 05/01/2013, 11:10 AM

## 2013-05-01 NOTE — Progress Notes (Signed)
Patient ID: [name], female   DOB: 19-Sep-1986, 27 y.o.   MRN: [id number]  Reviewed note by CNM.   UOP decreased overnight but improved with IVF.  Pt doing well.  Pt has minimal pain and does not want to take any pain medications.  Pt did take Ibuprofen.  Baby in NICU.  Stable and improving per pt.  She has not been informed on etiology of hypoglycemia.  Denies dizziness or SOB.  AFVSS UOP 500 cc/8 hrs Gen: NAD, comfortable.  Nursing baby. Abd:  Fundus firm, NT. Pressing dressing clean. Ext: no edema or calf tenderness. Labs: Hg 11.1 to 8.7  S/p primary c-section.  Doing well. Previously with decreased UOP but now adequate.  Po hydration, strict I/Os. Encouraged ambulation TID in halls. Routine postop care.

## 2013-05-02 NOTE — Clinical Social Work Note (Signed)
Clinical Social Work Department PSYCHOSOCIAL ASSESSMENT - MATERNAL/CHILD 05/02/2013  Patient:  [name],[name]  Account Number:  [id number]  Admit Date:  04/30/2013  Childs Name:   [name]    Clinical Social Worker:  Abdulkareem Badolato, LCSW   Date/Time:  05/02/2013 01:30 PM  Date Referred:  05/02/2013   Referral source  RN     Referred reason  NICU   Other referral source:   NICU Admission    I:  FAMILY / HOME ENVIRONMENT Child's legal guardian:  PARENT  Guardian - Name Guardian - Age Guardian - Address  [name] [address] [place], [place] [address]  [name]     Other household support members/support persons Name Relationship DOB  [name] FATHER    STEPMOTHER    Other support:   MOB reports good family support    II  PSYCHOSOCIAL DATA Information Source:  Patient Interview  Financial and Community Resources Employment:   Financial resources:  Medicaid If Medicaid - County:  [place] Other  WIC  Food Stamps   School / Grade:   Maternity Care Coordinator / Child Services Coordination / Early Interventions:  Cultural issues impacting care:    III  STRENGTHS Strengths  Home prepared for Child (including basic supplies)  Supportive family/friends  Adequate Resources  Compliance with medical plan  Understanding of illness   Strength comment:    IV  RISK FACTORS AND CURRENT PROBLEMS Current Problem:  None   Risk Factor & Current Problem Patient Issue Family Issue Risk Factor / Current Problem Comment   Nelson Nelson     V  SOCIAL WORK ASSESSMENT CSW met with MOB NICU with MOB's father.  CSW asked MOB if it was okay to consut with her for a moment and MOB said this was fine.  CSW discussed infant admission to NICU. MOB reports appropriate emotions around admission and feels she has good communication with doctors and nurses. CSW discussed with MOB any emotional concerns.  MOB reports no significant emotional concerns at this time.  CSW  discussed PPD. MOB reports knowing what symptoms to look out for. MOB reports this is her first baby and MOB is excited to be a parent.  CSW discussed supplies and family support.  MOB reports good family support.  MOB reports no concerns about supplies at this time.  No other social concerns at this time.  MOB was instructed to let RN or CSW know if any further concerns arise.  CSW continues to follow while infant in NICU.      VI SOCIAL WORK PLAN Social Work Plan  Psychosocial Support/Ongoing Assessment of Needs   Type of pt/family education:   PPD Symptoms   If child protective services report - county:   If child protective services report - date:   Information/referral to community resources comment:   Other social work plan:    

## 2013-05-02 NOTE — Progress Notes (Signed)
Subjective:  Postpartum Day 2: Cesarean Delivery  Patient reports tolerating PO.    Objective: Vital signs in last 24 hours: Temp:  [97.5 F (36.4 C)-98.7 F (37.1 C)] 98.6 F (37 C) (06/22 0620) Pulse Rate:  [66-85] 85 (06/22 0620) Resp:  [18] 18 (06/22 0620) BP: (99-126)/(52-70) 108/70 mmHg (06/22 0620) SpO2:  [98 %] 98 % (06/21 1000)  Physical Exam:  General: alert and no distress Lochia: appropriate Uterine Fundus: firm Incision: healing well DVT Evaluation: No evidence of DVT seen on physical exam.   Recent Labs  04/30/13 1220 05/01/13 0626  HGB 11.1* 8.7*  HCT 32.7* 26.2*    Assessment/Plan: Status post Cesarean section. Doing well postoperatively.  Continue current care. Her baby will be transferred from NICU to her room today. The plan is to go home tomorrow. She is undecided about circumcision at the moment. Wants an IUD for contraception.  Damilola Flamm V 05/02/2013, 8:57 AM

## 2013-05-03 ENCOUNTER — Encounter (HOSPITAL_COMMUNITY): Payer: Self-pay | Admitting: Obstetrics and Gynecology

## 2013-05-03 MED ORDER — OXYCODONE-ACETAMINOPHEN 5-325 MG PO TABS: 1.0000 | ORAL_TABLET | ORAL | Status: DC | PRN

## 2013-05-03 MED ORDER — IBUPROFEN 600 MG PO TABS: 600.0000 mg | ORAL_TABLET | Freq: Four times a day (QID) | ORAL | Status: DC | PRN

## 2013-05-03 NOTE — Lactation Note (Signed)
This note was copied from the chart of Pamela Niger. Lactation Consultation Note    Follow up consult with this mom of a term baby in NICU, with resolving hypoglycemia. Mom haas [facility] and I encouraged her to call and go to get a DEP today, after her hospital discharge. i reviewed hand expression with mom, and she was squirting mil, now at 68 hours post partum.   Mom has been breast feeding in the nICU, but has not been observed by a lactation consultant yet. I encouraged her to have the baby's nurse call for me the next time she breast feeds, especially prior to the baby's discharge to home.   Patient Name: [name] [id number] Date: 05/03/2013     Maternal Data    Feeding Feeding Type: Breast Milk with Formula added Feeding method: Bottle Nipple Type: Regular Length of feed: 10 min  LATCH Score/Interventions                      Lactation Tools Discussed/Used     Consult Status      [name] 05/03/2013, 2:15 PM

## 2013-05-03 NOTE — Discharge Summary (Signed)
  Cesarean Section Delivery Discharge Summary  [name]  DOB:    Mar 23, 1986 MRN:    [id number] CSN:    [id number]  Date of admission:                  04/30/13  Date of discharge:                   05/03/13  Procedures this admission:  Date of Delivery: 04/30/13  Newborn Data:  Live born female  Birth Weight: 7 lb 7.8 oz (3396 g) APGAR: 9, 9  Infant in NICU due to hypoglycemia, single episode of apnea  Circumcision Plan: Outpatient  History of Present Illness:  [name] is a 27 y.o. female, G2P1011, who presents at [redacted]w[redacted]d weeks gestation. The patient has been followed at the [facility] and Gynecology division of [facility].    Her pregnancy has been complicated by:  Patient Active Problem List   Diagnosis Date Noted  . Status post primary low transverse cesarean section 05/03/2013  . GASTRITIS 05/05/2010    Hospital course:  The patient was admitted for scheduled elective primary c/s per Dr. Stefano Gaul.   Her postpartum course was not complicated, other than her infant requiring admission to NICU on day 2 due to hypoglycemia, with a single episode of apnea occurring after NICU admission.  Patient did have anemia, but no orthostatic changes.  She declined transfusion.  She was discharged to home on postpartum day 3 doing well.  She was hoping to room in in NICU prior to baby's anticipated d/c on 05/05/13.  Feeding:  breast  Contraception:  IUD  Discharge hemoglobin:  Hemoglobin  Date Value Range Status  05/01/2013 8.7* 12.0 - 15.0 g/dL Final     REPEATED TO VERIFY     DELTA CHECK NOTED     HCT  Date Value Range Status  05/01/2013 26.2* 36.0 - 46.0 % Final    Discharge Physical Exam:   General: alert Lochia: appropriate Uterine Fundus: firm Incision: healing well DVT Evaluation: No evidence of DVT seen on physical exam. Negative Homan's sign.  Intrapartum Procedures: cesarean: low cervical,  transverse--primary elective Postpartum Procedures: none Complications-Operative and Postpartum: Anemia without hemodynamic compromise.  NICU infant  Discharge Diagnoses: Term Pregnancy-delivered  Discharge Information:  Activity:           Per [facility] handout Diet:                routine Medications: Ibuprofen, Iron and Percocet--Iron is OTC Condition:      stable Instructions:  refer to practice specific booklet Discharge to: home  Follow-up Information   Follow up with [facility]. Schedule an appointment as soon as possible for a visit in 5 weeks. (Call for any questions or concerns.)    Contact information:   [address] [place] [address] [phone]       [name] 05/03/2013

## 2013-05-04 LAB — RUBELLA SCREEN: Rubella: 0.66 Index (ref <0.90)

## 2013-05-06 ENCOUNTER — Inpatient Hospital Stay (HOSPITAL_COMMUNITY)
Admission: RE | Admit: 2013-05-06 | Payer: Medicaid Other | Source: Ambulatory Visit | Admitting: Obstetrics and Gynecology

## 2014-01-21 ENCOUNTER — Other Ambulatory Visit: Payer: Self-pay | Admitting: Family

## 2014-01-21 MED ORDER — NYSTATIN-TRIAMCINOLONE 100000-0.1 UNIT/GM-% EX OINT: 1.0000 "application " | TOPICAL_OINTMENT | Freq: Two times a day (BID) | CUTANEOUS | Status: DC

## 2014-03-06 ENCOUNTER — Encounter (HOSPITAL_COMMUNITY): Payer: Self-pay

## 2014-03-06 ENCOUNTER — Inpatient Hospital Stay (HOSPITAL_COMMUNITY)
Admission: AD | Admit: 2014-03-06 | Discharge: 2014-03-06 | Disposition: A | Discharge status: Home / Self Care | Payer: 59 | Source: Ambulatory Visit | Attending: Obstetrics and Gynecology | Admitting: Obstetrics and Gynecology

## 2014-03-06 DIAGNOSIS — Z711 Person with feared health complaint in whom no diagnosis is made: Secondary | ICD-10-CM | POA: Insufficient documentation

## 2014-03-06 LAB — POCT PREGNANCY, URINE: Preg Test, Ur: NEGATIVE

## 2014-03-06 NOTE — MAU Provider Note (Signed)
  History     CSN: [id number]  Arrival date and time: 03/06/14 1504  Provider here to evaluate patient@ 1620    Chief Complaint  Patient presents with  . Stuck Tampon    HPI  Thinks she has retained tampon in vagina since Friday- removed "pieces" but thinks some inside Unable to feel tampon  Moderate cramps on menses this cycle  Past Medical History  Diagnosis Date  . Medical history non-contributory   . Headache(784.0)   . H/O seasonal allergies     Past Surgical History  Procedure Laterality Date  . No past surgeries    . Cesarean section N/A 04/30/2013    Procedure: CESAREAN SECTION;  Surgeon: Kirkland HunArthur Stringer, MD;  Location: [facility];  Service: Obstetrics;  Laterality: N/A;    History reviewed. No pertinent family history.  History  Substance Use Topics  . Smoking status: Never Smoker   . Smokeless tobacco: Never Used  . Alcohol Use: No    Allergies:  Allergies  Allergen Reactions  . Percocet [Oxycodone-Acetaminophen] Itching    Prescriptions prior to admission  Medication Sig Dispense Refill  . fexofenadine-pseudoephedrine (ALLEGRA-D 24) 180-240 MG per 24 hr tablet Take 1 tablet by mouth as needed (allergies).        ROS  Physical Exam   Blood pressure 122/72, pulse 80, temperature 98.1 F (36.7 C), temperature source Oral, resp. rate 18, height 5\' 4"  (1.626 m), weight 92.25 kg (203 lb 6 oz), last menstrual period 03/03/2014, unknown if currently breastfeeding.  Physical Exam Abdomen soft and non-tender Spec exam - small menstrual blood - no odor No foreign body seen in vaginal vault VE no foreign felt in vaginal vault  MAU Course  Procedures   Assessment and Plan   questionable foreign body retained tampon - nothing present on exam  DC home  [name] 03/06/2014, 4:34 PM

## 2014-03-06 NOTE — Progress Notes (Signed)
Notified pt here for stuck tampon, no other issues. Will see pt in MAU.

## 2014-03-06 NOTE — MAU Note (Signed)
Pt states here for tampon stuck since Friday. Has attempted repeatedly and is only getting cotton fibers out. Denies any other issues

## 2014-04-21 ENCOUNTER — Other Ambulatory Visit: Payer: Self-pay | Admitting: Family

## 2014-04-21 MED ORDER — FLUCONAZOLE 150 MG PO TABS: 150.0000 mg | ORAL_TABLET | Freq: Once | ORAL | Status: DC

## 2014-06-13 ENCOUNTER — Emergency Department (HOSPITAL_BASED_OUTPATIENT_CLINIC_OR_DEPARTMENT_OTHER)
Admission: EM | Admit: 2014-06-13 | Discharge: 2014-06-13 | Disposition: A | Discharge status: Home / Self Care | Payer: 59 | Attending: Emergency Medicine | Admitting: Emergency Medicine

## 2014-06-13 ENCOUNTER — Encounter (HOSPITAL_BASED_OUTPATIENT_CLINIC_OR_DEPARTMENT_OTHER): Payer: Self-pay | Admitting: Emergency Medicine

## 2014-06-13 ENCOUNTER — Emergency Department (HOSPITAL_BASED_OUTPATIENT_CLINIC_OR_DEPARTMENT_OTHER): Payer: 59

## 2014-06-13 DIAGNOSIS — S3981XA Other specified injuries of abdomen, initial encounter: Secondary | ICD-10-CM | POA: Insufficient documentation

## 2014-06-13 DIAGNOSIS — Z3202 Encounter for pregnancy test, result negative: Secondary | ICD-10-CM | POA: Insufficient documentation

## 2014-06-13 DIAGNOSIS — S301XXA Contusion of abdominal wall, initial encounter: Secondary | ICD-10-CM | POA: Insufficient documentation

## 2014-06-13 LAB — URINALYSIS, ROUTINE W REFLEX MICROSCOPIC
Bilirubin Urine: NEGATIVE
Glucose, UA: NEGATIVE mg/dL
Hgb urine dipstick: NEGATIVE
Ketones, ur: NEGATIVE mg/dL
Leukocytes, UA: NEGATIVE
Nitrite: NEGATIVE
Protein, ur: NEGATIVE mg/dL
Specific Gravity, Urine: 1.027 (ref 1.005–1.030)
Urobilinogen, UA: 1 mg/dL (ref 0.0–1.0)
pH: 6 (ref 5.0–8.0)

## 2014-06-13 LAB — PREGNANCY, URINE: Preg Test, Ur: NEGATIVE

## 2014-06-13 MED ORDER — HYDROCODONE-ACETAMINOPHEN 5-325 MG PO TABS
1.0000 | ORAL_TABLET | Freq: Once | ORAL | Status: AC
  Administered 2014-06-13: 1 via ORAL
  Filled 2014-06-13: qty 1

## 2014-06-13 MED ORDER — ONDANSETRON 8 MG PO TBDP
8.0000 mg | ORAL_TABLET | Freq: Once | ORAL | Status: AC
  Administered 2014-06-13: 8 mg via ORAL
  Filled 2014-06-13: qty 1

## 2014-06-13 MED ORDER — TRAMADOL HCL 50 MG PO TABS: 50.0000 mg | ORAL_TABLET | Freq: Four times a day (QID) | ORAL | Status: DC | PRN
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
## 2014-06-13 NOTE — Discharge Instructions (Signed)
Blunt Abdominal Trauma °A blunt injury to the abdomen can cause pain. The pain is most likely from bruising and stretching of your muscles. This pain is often made worse with movement. Most often these injuries are not serious and get better within 1 week with rest and mild pain medicine. However, internal organs (liver, spleen, kidneys) can be injured with blunt trauma. If you do not get better or if you get worse, further examination may be needed. °Continue with your regular daily activities, but avoid any strenuous activities until your pain is improved. If your stomach is upset, stick to a clear liquid diet and slowly advance to solid food.  °SEEK IMMEDIATE MEDICAL CARE IF:  °· You develop increasing pain, nausea, or repeated vomiting. °· You develop chest pain or breathing difficulty. °· You develop blood in the urine, vomit, or stool. °· You develop weakness, fainting, fever, or other serious complaints. °Document Released: 12/05/2004 Document Revised: 01/20/2012 Document Reviewed: 03/23/2009 °ExitCare® Patient Information ©2015 [employer]. This information is not intended to replace advice given to you by your health care provider. Make sure you discuss any questions you have with your health care provider. ° °
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
## 2014-06-16 NOTE — ED Provider Notes (Signed)
Medical screening examination/treatment/procedure(s) were performed by non-physician practitioner and as supervising physician I was immediately available for consultation/collaboration.     Geoffery Lyonsouglas Blanche Gallien, MD 06/16/14 2134

## 2014-07-01 ENCOUNTER — Ambulatory Visit (INDEPENDENT_AMBULATORY_CARE_PROVIDER_SITE_OTHER): Payer: 59 | Admitting: Family

## 2014-07-01 ENCOUNTER — Encounter: Payer: Self-pay | Admitting: Family

## 2014-07-01 VITALS — BP 124/80 | HR 69 | Ht 64.0 in | Wt 190.0 lb

## 2014-07-01 DIAGNOSIS — G43009 Migraine without aura, not intractable, without status migrainosus: Secondary | ICD-10-CM | POA: Insufficient documentation

## 2014-07-01 DIAGNOSIS — G44209 Tension-type headache, unspecified, not intractable: Secondary | ICD-10-CM

## 2014-07-01 DIAGNOSIS — G43709 Chronic migraine without aura, not intractable, without status migrainosus: Secondary | ICD-10-CM

## 2014-07-01 MED ORDER — BUTALBITAL-APAP-CAFF-COD 50-325-40-30 MG PO CAPS: 1.0000 | ORAL_CAPSULE | Freq: Three times a day (TID) | ORAL | Status: DC | PRN

## 2014-07-01 MED ORDER — AMITRIPTYLINE HCL 10 MG PO TABS: 10.0000 mg | ORAL_TABLET | Freq: Every day | ORAL | Status: DC

## 2014-07-01 NOTE — Progress Notes (Signed)
Pre visit review using our clinic review tool, if applicable. No additional management support is needed unless otherwise documented below in the visit note. 

## 2014-07-01 NOTE — Progress Notes (Signed)
Subjective:    Patient ID: [name], female    DOB: [birth year], 28 y.o.   MRN: [id number]  HPI 28 year old AAF, nonsmoker, is in today to be established. 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 Reports having headaches on a daily basis. Has a history of migraine headaches in the past. Has tried Excedrin, BC, and tramadol without relief. Headaches last 4-5/10, occuring at the temples. Reports occasional nausea but no vomiting. No sensitivity to light or noise.    Review of Systems  Constitutional: Negative.   Respiratory: Negative.   Cardiovascular: Negative.   Gastrointestinal: Negative.   Endocrine: Negative.   Genitourinary: Negative.   Musculoskeletal: Negative.   Skin: Negative.   Neurological: Positive for headaches. Negative for dizziness.  Hematological: Negative.   Psychiatric/Behavioral: Positive for sleep disturbance.   Past Medical History  Diagnosis Date  . Medical history non-contributory   . Headache(784.0)   . H/O seasonal allergies     History   Social History  . Marital Status: Single    Spouse Name: N/A    Number of Children: N/A  . Years of Education: N/A   Occupational History  . Not on file.   Social History Main Topics  . Smoking status: Never Smoker   . Smokeless tobacco: Never Used  . Alcohol Use: Yes     Comment: occ  . Drug Use: No  . Sexual Activity: Not Currently   Other Topics Concern  . Not on file   Social History Narrative  . No narrative on file    Past Surgical History  Procedure Laterality Date  . No past surgeries    . Cesarean section N/A 04/30/2013    Procedure: CESAREAN SECTION;  Surgeon: Kirkland Hun, MD;  Location: [facility];  Service: Obstetrics;  Laterality: N/A;    History reviewed. No pertinent family history.  Allergies  Allergen Reactions  . Percocet [Oxycodone-Acetaminophen] Itching    Current Outpatient Prescriptions on File Prior to Visit  Medication  Sig Dispense Refill  . fexofenadine-pseudoephedrine (ALLEGRA-D 24) 180-240 MG per 24 hr tablet Take 1 tablet by mouth as needed (allergies).       No current facility-administered medications on file prior to visit.    BP 124/80  Pulse 69  Ht 5\' 4"  (1.626 m)  Wt 190 lb (86.183 kg)  BMI 32.60 kg/m2  LMP 07/20/2015chart     Objective:   Physical Exam  Constitutional: She is oriented to person, place, and time. She appears well-developed and well-nourished.  HENT:  Right Ear: External ear normal.  Left Ear: External ear normal.  Nose: Nose normal.  Mouth/Throat: Oropharynx is clear and moist.  Neck: Normal range of motion. Neck supple. No thyromegaly present.  Cardiovascular: Normal rate, regular rhythm and normal heart sounds.   Pulmonary/Chest: Effort normal and breath sounds normal.  Abdominal: Soft. Bowel sounds are normal.  Musculoskeletal: Normal range of motion.  Neurological: She is alert and oriented to person, place, and time.  Skin: Skin is warm and dry.  Psychiatric: She has a normal mood and affect.          Assessment & Plan:  [name] was seen today for establish care.  Diagnoses and associated orders for this visit:  Tension headache  Chronic migraine without aura without status migrainosus, not intractable  Other Orders - butalbital-acetaminophen-caffeine (FIORICET/CODEINE) 50-325-40-30 MG per capsule; Take 1 capsule by mouth 3 (three) times daily as needed for  headache. - amitriptyline (ELAVIL) 10 MG tablet; Take 1 tablet (10 mg total) by mouth at bedtime.    Obtain FMLA paperwork. Recheck in 4 weeks.

## 2014-07-01 NOTE — Patient Instructions (Signed)

## 2014-07-14 ENCOUNTER — Other Ambulatory Visit: Payer: Self-pay | Admitting: Family

## 2014-07-14 MED ORDER — AMOXICILLIN 500 MG PO TABS: 1000.0000 mg | ORAL_TABLET | Freq: Two times a day (BID) | ORAL | Status: DC

## 2014-08-01 ENCOUNTER — Ambulatory Visit: Payer: 59 | Admitting: Family

## 2014-08-09 ENCOUNTER — Ambulatory Visit: Payer: 59 | Admitting: Family

## 2014-09-12 ENCOUNTER — Encounter: Payer: Self-pay | Admitting: Family

## 2014-11-20 ENCOUNTER — Emergency Department (HOSPITAL_BASED_OUTPATIENT_CLINIC_OR_DEPARTMENT_OTHER)
Admission: EM | Admit: 2014-11-20 | Discharge: 2014-11-20 | Disposition: A | Discharge status: Home / Self Care | Payer: 59 | Attending: Emergency Medicine | Admitting: Emergency Medicine

## 2014-11-20 ENCOUNTER — Emergency Department (HOSPITAL_BASED_OUTPATIENT_CLINIC_OR_DEPARTMENT_OTHER): Payer: 59

## 2014-11-20 ENCOUNTER — Encounter (HOSPITAL_BASED_OUTPATIENT_CLINIC_OR_DEPARTMENT_OTHER): Payer: Self-pay | Admitting: *Deleted

## 2014-11-20 DIAGNOSIS — Z792 Long term (current) use of antibiotics: Secondary | ICD-10-CM | POA: Diagnosis not present

## 2014-11-20 DIAGNOSIS — R51 Headache: Secondary | ICD-10-CM

## 2014-11-20 DIAGNOSIS — Z79899 Other long term (current) drug therapy: Secondary | ICD-10-CM | POA: Diagnosis not present

## 2014-11-20 DIAGNOSIS — G43909 Migraine, unspecified, not intractable, without status migrainosus: Secondary | ICD-10-CM | POA: Insufficient documentation

## 2014-11-20 DIAGNOSIS — R519 Headache, unspecified: Secondary | ICD-10-CM

## 2014-11-20 HISTORY — DX: Migraine, unspecified, not intractable, without status migrainosus: G43.909

## 2014-11-20 MED ORDER — METOCLOPRAMIDE HCL 5 MG/ML IJ SOLN
10.0000 mg | Freq: Once | INTRAMUSCULAR | Status: AC
  Administered 2014-11-20: 10 mg via INTRAVENOUS
  Filled 2014-11-20: qty 2

## 2014-11-20 MED ORDER — SUMATRIPTAN SUCCINATE 25 MG PO TABS: 50.0000 mg | ORAL_TABLET | ORAL | Status: DC | PRN

## 2014-11-20 MED ORDER — DIPHENHYDRAMINE HCL 50 MG/ML IJ SOLN
25.0000 mg | Freq: Once | INTRAMUSCULAR | Status: AC
  Administered 2014-11-20: 25 mg via INTRAVENOUS
  Filled 2014-11-20: qty 1

## 2014-11-20 MED ORDER — SODIUM CHLORIDE 0.9 % IV BOLUS (SEPSIS)
1000.0000 mL | Freq: Once | INTRAVENOUS | Status: AC
  Administered 2014-11-20: 1000 mL via INTRAVENOUS

## 2014-11-20 MED ORDER — NAPROXEN 500 MG PO TABS: 500.0000 mg | ORAL_TABLET | Freq: Two times a day (BID) | ORAL | Status: DC

## 2014-11-20 MED ORDER — KETOROLAC TROMETHAMINE 30 MG/ML IJ SOLN
30.0000 mg | Freq: Once | INTRAMUSCULAR | Status: AC
  Administered 2014-11-20: 30 mg via INTRAVENOUS
  Filled 2014-11-20: qty 1

## 2014-11-20 NOTE — Discharge Instructions (Signed)
Headache: ° °You are having a headache. No specific cause was found today for your headache. It may have been a migraine or other cause of headache. Stress, anxiety, fatigue, and depression are common triggers for headaches. Your headache today does not appear to be life-threatening or require hospitalization, but often the exact cause of headaches is not determined in the emergency department. Therefore, followup with your doctor is very important to find out what may have caused your headache, and whether or not you need any further diagnostic testing or treatment. Sometimes headaches can appear benign but then more serious symptoms can develop which should prompt an immediate reevaluation by your doctor or the emergency department. ° °Seek immediate medical attention if: ° °· You develop possible problems with medications prescribed. °· The medications don't resolve your headache, if it recurs, or if you have multiple episodes of vomiting or can't take fluids by mouth °· You have a change from the usual headache. °· If you developed a sudden severe headache or confusion, become poorly responsive or faint, developed a fever above 100.4 or problems breathing, have a change in speech, vision, swallowing or understanding, or developed new weakness, numbness, tingling, incoordination or have a seizure. ° °If you don't have a family doctor to follow up with, see the follow up list below - call this morning for a follow-up appointment in the next 1-2 days. ° °

## 2014-11-20 NOTE — ED Notes (Signed)
Pt has a history of migraines, has a migraine today that five days ago

## 2014-11-20 NOTE — ED Provider Notes (Signed)
CSN: [id number]     Arrival date & time 11/20/14  1421 History  This chart was scribed for Pamela Roller, MD by Evon Slack, ED Scribe. This patient was seen in room MH07/MH07 and the patient's care was started at 4:02 PM.      Chief Complaint  Patient presents with  . Migraine   Patient is a 29 y.o. female presenting with migraines. The history is provided by the patient. No language interpreter was used.  Migraine   HPI Comments: [name] is a 29 y.o. female with PMHx of migraine headaches who presents to the Emergency Department complaining of constant generalized migraine HA onset 5 days prior. Pt is unable to describe the type of HA and states that it is worse than previous Headaches. Pt states she has associated photophobia. Pt states she has tried [facility] powder, OTC sinus medication, Fioricet, tramadol and Excedrin with no relief. Pt state she has been having some sinus drainage as well. Denies fever, neck stiffness, nausea, vomiting or sore throat.   Past Medical History  Diagnosis Date  . Medical history non-contributory   . Headache(784.0)   . H/O seasonal allergies   . Migraine    Past Surgical History  Procedure Laterality Date  . No past surgeries    . Cesarean section N/A 04/30/2013    Procedure: CESAREAN SECTION;  Surgeon: Kirkland Hun, MD;  Location: [facility];  Service: Obstetrics;  Laterality: N/A;   No family history on file. History  Substance Use Topics  . Smoking status: Never Smoker   . Smokeless tobacco: Never Used  . Alcohol Use: Yes     Comment: occ   OB History    Gravida Para Term Preterm AB TAB SAB Ectopic Multiple Living   Review of Systems  All other systems reviewed and are negative.     Allergies  Percocet  Home Medications   Prior to Admission medications   Medication Sig Start Date End Date Taking? Authorizing Provider  amitriptyline (ELAVIL) 10 MG tablet Take 1 tablet (10 mg total) by mouth at  bedtime. 07/01/14   Baker Pierini, FNP  amoxicillin (AMOXIL) 500 MG tablet Take 2 tablets (1,000 mg total) by mouth 2 (two) times daily. 07/14/14   Baker Pierini, FNP  butalbital-acetaminophen-caffeine (FIORICET/CODEINE) [phone] MG per capsule Take 1 capsule by mouth 3 (three) times daily as needed for headache. 07/01/14   Baker Pierini, FNP  fexofenadine-pseudoephedrine (ALLEGRA-D 24) 180-240 MG per 24 hr tablet Take 1 tablet by mouth as needed (allergies).    Historical Provider, MD  naproxen (NAPROSYN) 500 MG tablet Take 1 tablet (500 mg total) by mouth 2 (two) times daily with a meal. 11/20/14   Pamela Roller, MD  SUMAtriptan (IMITREX) 25 MG tablet Take 2 tablets (50 mg total) by mouth every 2 (two) hours as needed for migraine (ongoing headache). Maximum daily dose  11/20/14   Pamela Roller, MD   Triage Vitals: BP 132/70 mmHg  Pulse 81  Temp(Src) 97.5 F (36.4 C) (Oral)  Resp 16  Ht  (1.626 m)  Wt 190 lb (86.183 kg)  BMI 32.60 kg/m2  SpO2 100%  Physical Exam  Constitutional: She appears well-developed and well-nourished. No distress.  HENT:  Head: Normocephalic and atraumatic.  Mouth/Throat: Oropharynx is clear and moist. No oropharyngeal exudate.  Clear oropharynx, moist mucous membranes, no nasal discharge or swelling of the  turbinates  Eyes: Conjunctivae and EOM are normal. Pupils are equal, round, and reactive to light. Right eye exhibits no discharge. Left eye exhibits no discharge. No scleral icterus.  Neck: Normal range of motion. Neck supple. No JVD present. No thyromegaly present.  Cardiovascular: Normal rate, regular rhythm, normal heart sounds and intact distal pulses.  Exam reveals no gallop and no friction rub.   No murmur heard. Pulmonary/Chest: Effort normal and breath sounds normal. No respiratory distress. She has no wheezes. She has no rales.  Abdominal: Soft. Bowel sounds are normal. She exhibits no distension and no mass. There is no  tenderness.  Musculoskeletal: Normal range of motion. She exhibits no edema or tenderness.  Lymphadenopathy:    She has no cervical adenopathy.  Neurological: She is alert. Coordination normal.  Speech is clear, cranial nerves III through XII are intact, memory is intact, strength is normal in all 4 extremities including grips, sensation is intact to light touch and pinprick in all 4 extremities. Coordination as tested by finger-nose-finger is normal, no limb ataxia. Normal gait, normal reflexes at the patellar tendons bilaterally  Skin: Skin is warm and dry. No rash noted. No erythema.  Psychiatric: She has a normal mood and affect. Her behavior is normal.  Nursing note and vitals reviewed.   ED Course  Procedures (including critical care time) DIAGNOSTIC STUDIES: Oxygen Saturation is 100% on RA, normal by my interpretation.    COORDINATION OF CARE: 4:15 PM-Discussed treatment plan with pt at bedside and pt agreed to plan.     Labs Review Labs Reviewed - No data to display  Imaging Review Ct Head Wo Contrast  11/20/2014   CLINICAL DATA:  Chronic history of migraine headaches. Headaches 5 days.  EXAM: CT HEAD WITHOUT CONTRAST  TECHNIQUE: Contiguous axial images were obtained from the base of the skull through the vertex without intravenous contrast.  COMPARISON:  None.  FINDINGS: There is no evidence of mass effect, midline shift or extra-axial fluid collections. There is no evidence of a space-occupying lesion or intracranial hemorrhage. There is no evidence of a cortical-based area of acute infarction.  The ventricles and sulci are appropriate for the patient's age. The basal cisterns are patent.  Visualized portions of the orbits are unremarkable. The visualized portions of the paranasal sinuses and mastoid air cells are unremarkable.  The osseous structures are unremarkable.  IMPRESSION: Normal CT of the brain without intravenous contrast.   Electronically Signed   By: Elige Ko    On: 11/20/2014 17:30      MDM   Final diagnoses:  Headache   the patient has a significant improvement in her headache after receiving medications as below, she requested a CT scan because of the severity of her headache and the ongoing headaches over time, this was negative for masses or hemorrhage, I discussed with her regarding subarachnoid hemorrhage or aneurysm and the decreased sensitivity given the time since starting the headache, she declines lumbar puncture but does want a CT scan of the head. After medicine her pain level is 3 out of 10, vital signs are unremarkable, she is amenable to discharge with the medications listed as below.   Meds given in ED:  Medications  ketorolac (TORADOL) 30 MG/ML injection 30 mg (30 mg Intravenous Given 11/20/14 1704)  metoCLOPramide (REGLAN) injection 10 mg (10 mg Intravenous Given 11/20/14 1704)  diphenhydrAMINE (BENADRYL) injection 25 mg (25 mg Intravenous Given 11/20/14 1704)  sodium chloride 0.9 % bolus 1,000 mL (0 mLs Intravenous  Stopped 11/20/14 1745)    New Prescriptions   NAPROXEN (NAPROSYN) 500 MG TABLET    Take 1 tablet (500 mg total) by mouth 2 (two) times daily with a meal.   SUMATRIPTAN (IMITREX) 25 MG TABLET    Take 2 tablets (50 mg total) by mouth every 2 (two) hours as needed for migraine (ongoing headache). Maximum daily dose 200mg      I personally performed the services described in this documentation, which was scribed in my presence. The recorded information has been reviewed and is accurate.      Pamela RollerBrian D Knight Oelkers, MD 11/20/14 [phone]

## 2014-11-22 ENCOUNTER — Ambulatory Visit (INDEPENDENT_AMBULATORY_CARE_PROVIDER_SITE_OTHER): Payer: 59 | Admitting: *Deleted

## 2014-11-22 DIAGNOSIS — G43909 Migraine, unspecified, not intractable, without status migrainosus: Secondary | ICD-10-CM

## 2014-11-22 MED ORDER — PROMETHAZINE HCL 50 MG/ML IJ SOLN
50.0000 mg | Freq: Once | INTRAMUSCULAR | Status: AC
Start: 2014-11-22 — End: 2014-11-22
  Administered 2014-11-22: 25 mg via INTRAMUSCULAR

## 2014-11-22 MED ORDER — MEPERIDINE HCL 50 MG/ML IJ SOLN: 50.0000 mg | Freq: Once | INTRAMUSCULAR | Status: DC

## 2014-11-22 MED ORDER — MEPERIDINE HCL 50 MG/ML IJ SOLN
50.0000 mg | Freq: Once | INTRAMUSCULAR | Status: AC
  Administered 2014-11-22: 25 mg via INTRAMUSCULAR

## 2014-11-23 ENCOUNTER — Other Ambulatory Visit: Payer: Self-pay | Admitting: Family

## 2014-11-23 MED ORDER — SUMATRIPTAN SUCCINATE 25 MG PO TABS: 50.0000 mg | ORAL_TABLET | ORAL | Status: DC | PRN

## 2014-11-23 MED ORDER — PREDNISONE 20 MG PO TABS: ORAL_TABLET | ORAL | Status: AC

## 2015-02-17 DIAGNOSIS — J302 Other seasonal allergic rhinitis: Secondary | ICD-10-CM | POA: Insufficient documentation

## 2015-05-05 DIAGNOSIS — Z800 Family history of malignant neoplasm of digestive organs: Secondary | ICD-10-CM | POA: Insufficient documentation

## 2015-05-05 DIAGNOSIS — N920 Excessive and frequent menstruation with regular cycle: Secondary | ICD-10-CM | POA: Insufficient documentation

## 2015-05-05 DIAGNOSIS — K219 Gastro-esophageal reflux disease without esophagitis: Secondary | ICD-10-CM | POA: Insufficient documentation

## 2015-08-18 ENCOUNTER — Ambulatory Visit (INDEPENDENT_AMBULATORY_CARE_PROVIDER_SITE_OTHER): Payer: BC Managed Care – PPO | Admitting: Physician Assistant

## 2015-08-18 VITALS — BP 106/66 | HR 74 | Temp 98.2°F | Resp 16 | Ht 63.0 in | Wt 210.2 lb

## 2015-08-18 DIAGNOSIS — B9689 Other specified bacterial agents as the cause of diseases classified elsewhere: Secondary | ICD-10-CM

## 2015-08-18 DIAGNOSIS — A499 Bacterial infection, unspecified: Secondary | ICD-10-CM | POA: Diagnosis not present

## 2015-08-18 DIAGNOSIS — N760 Acute vaginitis: Secondary | ICD-10-CM | POA: Diagnosis not present

## 2015-08-18 DIAGNOSIS — N898 Other specified noninflammatory disorders of vagina: Secondary | ICD-10-CM | POA: Diagnosis not present

## 2015-08-18 LAB — POCT WET + KOH PREP
Trich by wet prep: ABSENT
Yeast by KOH: ABSENT
Yeast by wet prep: ABSENT

## 2015-08-18 MED ORDER — TINIDAZOLE 500 MG PO TABS: ORAL_TABLET | ORAL | Status: DC

## 2015-08-18 NOTE — Patient Instructions (Signed)
Take tindamax 4 tabs x 2 days then stop. Follow up with your GYN if does not clear up.

## 2015-08-18 NOTE — Progress Notes (Signed)
[facility] [address] [place] [address] [id number] [phone]  Date:  08/18/2015   Name:  [name]   DOB:  January 17, 1986   MRN:  [id number]  PCP:  Eulis Foster, FNP    Chief Complaint: Vaginitis; Vaginal Discharge; and Vaginal Itching   History of Present Illness:  This is a 29 y.o. female with PMH migraines who is presenting with vaginal itching and vaginal discharge x 2 weeks. Started after period ended on 9/25. This was her first period in 3 months. States several years ago she used to get BV after every period. Discharge initially was white and thick, now it is brown and thin. Her mom had extra metronidazole that she took but states she took inconsistently over 7 days times. Yesterday she had a provider friend send in diflucan. She still has symptoms. Last sexually active 4 months ago with her fiance, has had same partner for years. Last tested for STD 4 months ago and negative. She is planning to f/u with her GYN soon d/t her menstrual irregularities. She denies abdominal pain, dysuria, urinary freq, fever, chills, back pain.  Review of Systems:  Review of Systems See HPI  Patient Active Problem List   Diagnosis Date Noted  . Migraine, chronic, without aura 07/01/2014  . Status post primary low transverse cesarean section 05/03/2013  . GASTRITIS 05/05/2010    Prior to Admission medications   Medication Sig Start Date End Date Taking? Authorizing Provider  amitriptyline (ELAVIL) 10 MG tablet Take 1 tablet (10 mg total) by mouth at bedtime. 07/01/14  Yes Eulis Foster, FNP  SUMAtriptan (IMITREX) 25 MG tablet Take 2 tablets (50 mg total) by mouth every 2 (two) hours as needed for migraine (ongoing headache). Maximum daily dose  11/23/14  Yes Eulis Foster, FNP                         Allergies  Allergen Reactions  . Percocet [Oxycodone-Acetaminophen] Itching    Past Surgical History  Procedure Laterality Date  . No past surgeries    .  Cesarean section N/A 04/30/2013    Procedure: CESAREAN SECTION;  Surgeon: Kirkland Hun, MD;  Location: [facility];  Service: Obstetrics;  Laterality: N/A;    Social History  Substance Use Topics  . Smoking status: Never Smoker   . Smokeless tobacco: Never Used  . Alcohol Use: Yes     Comment: occ    History reviewed. No pertinent family history.  Medication list has been reviewed and updated.  Physical Examination:  Physical Exam  Constitutional: She is oriented to person, place, and time. She appears well-developed and well-nourished. No distress.  HENT:  Head: Normocephalic and atraumatic.  Right Ear: Hearing normal.  Left Ear: Hearing normal.  Nose: Nose normal.  Eyes: Conjunctivae and lids are normal. Right eye exhibits no discharge. Left eye exhibits no discharge. No scleral icterus.  Pulmonary/Chest: Effort normal and breath sounds normal. No respiratory distress.  Abdominal: Soft. Normal appearance. There is no tenderness.  Genitourinary: Uterus normal. There is no lesion on the right labia. There is no lesion on the left labia. Cervix exhibits no motion tenderness, no discharge and no friability. Right adnexum displays no tenderness and no fullness. Left adnexum displays no tenderness and no fullness. Vaginal discharge (thin, white) found.  Musculoskeletal: Normal range of motion.  Neurological: She is alert and oriented to person, place, and time.  Skin: Skin is warm,  dry and intact. No lesion and no rash noted.  Psychiatric: She has a normal mood and affect. Her speech is normal and behavior is normal. Thought content normal.   BP 106/66 mmHg  Pulse 74  Temp(Src) 98.2 F (36.8 C) (Oral)  Resp 16  Ht  (1.6 m)  Wt 210 lb 3.2 oz (95.346 kg)  BMI 37.24 kg/m2  LMP 08/01/2015  Results for orders placed or performed in visit on 08/18/15  POCT Wet + KOH Prep ([facility])  Result Value Ref Range   Yeast by KOH Absent Present, Absent   Yeast by wet prep Absent Present,  Absent   WBC by wet prep Many (A) None, Few   Clue Cells Wet Prep HPF POC Moderate (A) None   Trich by wet prep Absent Present, Absent   Bacteria Wet Prep HPF POC Many (A) None, Few   Epithelial Cells By [employer] Pref ([facility]) Many (A) None, Few   RBC,UR,HPF,POC Few (A) None RBC/hpf   Assessment and Plan:  1. BV (bacterial vaginosis) 2. Vaginal discharge Pt requested treatment with tindamax as has been successful in the past. G/C pending. Return or f/u with GYN is sx not improved in 1 week. - tinidazole (TINDAMAX) 500 MG tablet; Take 4 tabs (2 gm) po daily x 2 days then stop.  Dispense: 8 tablet; Refill: 1 - POCT Wet + KOH Prep ([facility]) - GC/Chlamydia Probe Amp   [employer] [employer]. Dyke Brackett, MHS [facility]  08/18/2015

## 2015-08-22 ENCOUNTER — Telehealth: Payer: Self-pay

## 2015-08-22 LAB — GC/CHLAMYDIA PROBE AMP
CT Probe RNA: NEGATIVE
GC Probe RNA: NEGATIVE

## 2015-08-22 NOTE — Progress Notes (Signed)
Tried to call Pt. About lab results no answer nor was there a VM set up to leave a message.

## 2015-09-21 NOTE — Telephone Encounter (Signed)
No msg °

## 2015-11-17 ENCOUNTER — Emergency Department (HOSPITAL_BASED_OUTPATIENT_CLINIC_OR_DEPARTMENT_OTHER)
Admission: EM | Admit: 2015-11-17 | Discharge: 2015-11-17 | Disposition: A | Discharge status: Home / Self Care | Payer: BC Managed Care – PPO | Attending: Emergency Medicine | Admitting: Emergency Medicine

## 2015-11-17 ENCOUNTER — Encounter (HOSPITAL_BASED_OUTPATIENT_CLINIC_OR_DEPARTMENT_OTHER): Payer: Self-pay | Admitting: *Deleted

## 2015-11-17 ENCOUNTER — Emergency Department (HOSPITAL_BASED_OUTPATIENT_CLINIC_OR_DEPARTMENT_OTHER): Payer: BC Managed Care – PPO

## 2015-11-17 DIAGNOSIS — Z79899 Other long term (current) drug therapy: Secondary | ICD-10-CM | POA: Diagnosis not present

## 2015-11-17 DIAGNOSIS — R079 Chest pain, unspecified: Secondary | ICD-10-CM | POA: Diagnosis present

## 2015-11-17 DIAGNOSIS — H81399 Other peripheral vertigo, unspecified ear: Secondary | ICD-10-CM | POA: Insufficient documentation

## 2015-11-17 DIAGNOSIS — G43909 Migraine, unspecified, not intractable, without status migrainosus: Secondary | ICD-10-CM | POA: Diagnosis not present

## 2015-11-17 DIAGNOSIS — Z3202 Encounter for pregnancy test, result negative: Secondary | ICD-10-CM | POA: Diagnosis not present

## 2015-11-17 DIAGNOSIS — H538 Other visual disturbances: Secondary | ICD-10-CM | POA: Insufficient documentation

## 2015-11-17 LAB — BASIC METABOLIC PANEL
Anion gap: 4 — ABNORMAL LOW (ref 5–15)
BUN: 11 mg/dL (ref 6–20)
CO2: 25 mmol/L (ref 22–32)
Calcium: 9 mg/dL (ref 8.9–10.3)
Chloride: 107 mmol/L (ref 101–111)
Creatinine, Ser: 0.86 mg/dL (ref 0.44–1.00)
GFR calc Af Amer: >60 mL/min (ref >60)
GFR calc non Af Amer: >60 mL/min (ref >60)
Glucose, Bld: 94 mg/dL (ref 65–99)
Potassium: 4.1 mmol/L (ref 3.5–5.1)
Sodium: 136 mmol/L (ref 135–145)

## 2015-11-17 LAB — CBC WITH DIFFERENTIAL/PLATELET
Basophils Absolute: 0 10*3/uL (ref 0.0–0.1)
Basophils Relative: 0 %
Eosinophils Absolute: 0.1 10*3/uL (ref 0.0–0.7)
Eosinophils Relative: 2 %
HCT: 38.1 % (ref 36.0–46.0)
Hemoglobin: 12.1 g/dL (ref 12.0–15.0)
Lymphocytes Relative: 36 %
Lymphs Abs: 2.3 10*3/uL (ref 0.7–4.0)
MCH: 26.8 pg (ref 26.0–34.0)
MCHC: 31.8 g/dL (ref 30.0–36.0)
MCV: 84.5 fL (ref 78.0–100.0)
Monocytes Absolute: 0.5 10*3/uL (ref 0.1–1.0)
Monocytes Relative: 7 %
Neutro Abs: 3.6 10*3/uL (ref 1.7–7.7)
Neutrophils Relative %: 55 %
Platelets: 235 10*3/uL (ref 150–400)
RBC: 4.51 MIL/uL (ref 3.87–5.11)
RDW: 14.6 % (ref 11.5–15.5)
WBC: 6.5 10*3/uL (ref 4.0–10.5)

## 2015-11-17 LAB — URINALYSIS, ROUTINE W REFLEX MICROSCOPIC
Bilirubin Urine: NEGATIVE
Glucose, UA: NEGATIVE mg/dL
Ketones, ur: NEGATIVE mg/dL
Leukocytes, UA: NEGATIVE
Nitrite: NEGATIVE
Protein, ur: NEGATIVE mg/dL
Specific Gravity, Urine: 1.012 (ref 1.005–1.030)
pH: 6.5 (ref 5.0–8.0)

## 2015-11-17 LAB — URINE MICROSCOPIC-ADD ON: WBC, UA: NONE SEEN WBC/hpf (ref 0–5)

## 2015-11-17 LAB — PREGNANCY, URINE: Preg Test, Ur: NEGATIVE

## 2015-11-17 MED ORDER — MECLIZINE HCL 25 MG PO TABS: 25.0000 mg | ORAL_TABLET | Freq: Three times a day (TID) | ORAL | Status: DC | PRN

## 2015-11-17 MED ORDER — MECLIZINE HCL 25 MG PO TABS
25.0000 mg | ORAL_TABLET | Freq: Once | ORAL | Status: AC
  Administered 2015-11-17: 25 mg via ORAL
  Filled 2015-11-17: qty 1

## 2015-11-17 NOTE — Discharge Instructions (Signed)
Benign Positional Vertigo Vertigo is the feeling that you or your surroundings are moving when they are not. Benign positional vertigo is the most common form of vertigo. The cause of this condition is not serious (is benign). This condition is triggered by certain movements and positions (is positional). This condition can be dangerous if it occurs while you are doing something that could endanger you or others, such as driving.  CAUSES In many cases, the cause of this condition is not known. It may be caused by a disturbance in an area of the inner ear that helps your brain to sense movement and balance. This disturbance can be caused by a viral infection (labyrinthitis), head injury, or repetitive motion. RISK FACTORS This condition is more likely to develop in:  Women.  People who are 50 years of age or older. SYMPTOMS Symptoms of this condition usually happen when you move your head or your eyes in different directions. Symptoms may start suddenly, and they usually last for less than a minute. Symptoms may include:  Loss of balance and falling.  Feeling like you are spinning or moving.  Feeling like your surroundings are spinning or moving.  Nausea and vomiting.  Blurred vision.  Dizziness.  Involuntary eye movement (nystagmus). Symptoms can be mild and cause only slight annoyance, or they can be severe and interfere with daily life. Episodes of benign positional vertigo may return (recur) over time, and they may be triggered by certain movements. Symptoms may improve over time. DIAGNOSIS This condition is usually diagnosed by medical history and a physical exam of the head, neck, and ears. You may be referred to a health care provider who specializes in ear, nose, and throat (ENT) problems (otolaryngologist) or a provider who specializes in disorders of the nervous system (neurologist). You may have additional testing, including:  MRI.  A CT scan.  Eye movement tests. Your  health care provider may ask you to change positions quickly while he or she watches you for symptoms of benign positional vertigo, such as nystagmus. Eye movement may be tested with an electronystagmogram (ENG), caloric stimulation, the Dix-Hallpike test, or the roll test.  An electroencephalogram (EEG). This records electrical activity in your brain.  Hearing tests. TREATMENT Usually, your health care provider will treat this by moving your head in specific positions to adjust your inner ear back to normal. Surgery may be needed in severe cases, but this is rare. In some cases, benign positional vertigo may resolve on its own in 2-4 weeks. HOME CARE INSTRUCTIONS Safety  Move slowly.Avoid sudden body or head movements.  Avoid driving.  Avoid operating heavy machinery.  Avoid doing any tasks that would be dangerous to you or others if a vertigo episode would occur.  If you have trouble walking or keeping your balance, try using a cane for stability. If you feel dizzy or unstable, sit down right away.  Return to your normal activities as told by your health care provider. Ask your health care provider what activities are safe for you. General Instructions  Take over-the-counter and prescription medicines only as told by your health care provider.  Avoid certain positions or movements as told by your health care provider.  Drink enough fluid to keep your urine clear or pale yellow.  Keep all follow-up visits as told by your health care provider. This is important. SEEK MEDICAL CARE IF:  You have a fever.  Your condition gets worse or you develop new symptoms.  Your family or friends   notice any behavioral changes.  Your nausea or vomiting gets worse.  You have numbness or a "pins and needles" sensation. SEEK IMMEDIATE MEDICAL CARE IF:  You have difficulty speaking or moving.  You are always dizzy.  You faint.  You develop severe headaches.  You have weakness in your  legs or arms.  You have changes in your hearing or vision.  You develop a stiff neck.  You develop sensitivity to light.   This information is not intended to replace advice given to you by your health care provider. Make sure you discuss any questions you have with your health care provider.   Document Released: 08/05/2006 Document Revised: 07/19/2015 Document Reviewed: 02/20/2015 Elsevier Interactive Patient Education 2016 [employer]vier Inc[employer]  

## 2015-11-17 NOTE — ED Notes (Signed)
Pt c/o central cp and dizziness x 15 mins ago

## 2015-11-17 NOTE — ED Provider Notes (Signed)
CSN: [id number]     Arrival date & time 11/17/15  1755 History  By signing my name below, I, Ronney LionSuzanne Le, attest that this documentation has been prepared under the direction and in the presence of Gwyneth SproutWhitney Cary Lothrop, MD. Electronically Signed: Ronney LionSuzanne Le, ED Scribe. 11/17/2015. 8:24 PM.    Chief Complaint  Patient presents with  . Chest Pain   Patient is a 30 y.o. female presenting with chest pain. The history is provided by the patient. No language interpreter was used.  Chest Pain Pain location:  Substernal area Pain quality: throbbing   Pain radiates to:  Does not radiate Pain severity:  Moderate Duration:  30 minutes Timing:  Constant Chronicity:  New Relieved by:  None tried Worsened by:  Nothing tried Ineffective treatments:  None tried Risk factors: no diabetes mellitus and no prior DVT/PE    HPI Comments: Melanie CrazierBrittany N Angst is a 30 y.o. female with a history of migraines, who presents to the Emergency Department complaining of acute-onset, constant, moderate, throbbing center chest pain that began 30 minutes ago while at the grocery store. She also complains of associated room-spinning dizziness and blurred vision, which she describes as feeling like there is a film in front of her eyes. She states that her dizziness onset just prior to her chest pain and blurred vision. Patient states she has had intermittent episodes of room-spinning dizziness with no known trigger for the past 6 months, with episodes occurring at a frequency of 1-2 times a week at most, but she has never had chest pain with her dizziness as she did today. Standing seems to exacerbate her dizziness. Head movements and position changes do not affect it. Deep inspiration does not affect her chest pain. Patient has a history of migraines but states her dizziness is not connected in any way to her migraines; she also denies getting migraine auras. Patient states she has seen a neurologist for her migraines in the past but  stopped taking medications for her migraines due to adverse side effects; she has not been medically evaluated for her dizzy spells. Patient does state she had a URI last week. She states she has been eating and drinking normally and denies any recent medication changes or recent stressors. Patient states her LMP began 3 days ago and is still currently menstruating. She denies any personal or family history of CVA, DVT/PE, or DM. She denies a personal history of RA or lupus. She denies tinnitus, otalgia, SOB, nausea, vomiting, cough, or leg swelling.    Past Medical History  Diagnosis Date  . Medical history non-contributory   . Headache(784.0)   . H/O seasonal allergies   . Migraine    Past Surgical History  Procedure Laterality Date  . No past surgeries    . Cesarean section N/A 04/30/2013    Procedure: CESAREAN SECTION;  Surgeon: Kirkland HunArthur Stringer, MD;  Location: [facility];  Service: Obstetrics;  Laterality: N/A;   History reviewed. No pertinent family history. Social History  Substance Use Topics  . Smoking status: Never Smoker   . Smokeless tobacco: Never Used  . Alcohol Use: Yes     Comment: occ   OB History    Gravida Para Term Preterm AB TAB SAB Ectopic Multiple Living   2 1 1  1 1    1      Review of Systems   A complete 10 system review of systems was obtained and all systems are negative except as noted in the HPI and PMH.  Allergies  Percocet  Home Medications   Prior to Admission medications   Medication Sig Start Date End Date Taking? Authorizing Provider  amitriptyline (ELAVIL) 10 MG tablet Take 1 tablet (10 mg total) by mouth at bedtime. 07/01/14   Eulis Foster, FNP  butalbital-acetaminophen-caffeine (FIORICET/CODEINE) [id number] MG per capsule Take 1 capsule by mouth 3 (three) times daily as needed for headache. Patient not taking: Reported on 08/18/2015 07/01/14   Eulis Foster, FNP  fexofenadine-pseudoephedrine (ALLEGRA-D 24) 180-240 MG per 24 hr tablet Take  1 tablet by mouth as needed (allergies).    Historical Provider, MD  naproxen (NAPROSYN) 500 MG tablet Take 1 tablet (500 mg total) by mouth 2 (two) times daily with a meal. Patient not taking: Reported on 08/18/2015 11/20/14   Eber Hong, MD  SUMAtriptan (IMITREX) 25 MG tablet Take 2 tablets (50 mg total) by mouth every 2 (two) hours as needed for migraine (ongoing headache). Maximum daily dose 200mg  11/23/14   Eulis Foster, FNP  tinidazole (TINDAMAX) 500 MG tablet Take 4 tabs (2 gm) po daily x 2 days then stop. 08/18/15   Dorna Leitz, PA-C   BP 119/106 mmHg  Pulse 59  Temp(Src) 98.7 F (37.1 C)  Resp 16  Ht 5\' 4"  (1.626 m)  Wt 201 lb (91.173 kg)  BMI 34.48 kg/m2  SpO2 100%  LMP 11/14/2015 Physical Exam  Constitutional: She is oriented to person, place, and time. She appears well-developed and well-nourished. No distress.  HENT:  Head: Normocephalic and atraumatic.  Eyes: Conjunctivae and EOM are normal. Pupils are equal, round, and reactive to light.  No nystagmus. PERRL  Neck: Neck supple. No tracheal deviation present.  Cardiovascular: Normal rate, regular rhythm and normal heart sounds.   Pulmonary/Chest: Effort normal. No respiratory distress. She exhibits tenderness.  Mild right-sided chest wall TTP.  Abdominal: Soft. Bowel sounds are normal. She exhibits no distension and no mass. There is no tenderness. There is no rebound and no guarding.  Musculoskeletal: Normal range of motion.  Neurological: She is alert and oriented to person, place, and time. No cranial nerve deficit.  Normal heel-to-shin testing. 5/5 strength in BUE and BLE. No sensation deficits. Normal speech.  Skin: Skin is warm and dry.  Psychiatric: She has a normal mood and affect. Her behavior is normal.  Nursing note and vitals reviewed.   ED Course  Procedures (including critical care time)  DIAGNOSTIC STUDIES: Oxygen Saturation is 100% on RA, normal by my interpretation.    COORDINATION OF  CARE: 6:28 PM - Discussed treatment plan with pt at bedside. Pt verbalized understanding and agreed to plan.   Labs Review Labs Reviewed  BASIC METABOLIC PANEL - Abnormal; Notable for the following:    Anion gap 4 (*)    All other components within normal limits  URINALYSIS, ROUTINE W REFLEX MICROSCOPIC (NOT AT [facility]) - Abnormal; Notable for the following:    APPearance CLOUDY (*)    Hgb urine dipstick MODERATE (*)    All other components within normal limits  URINE MICROSCOPIC-ADD ON - Abnormal; Notable for the following:    Squamous Epithelial / LPF 0-5 (*)    Bacteria, UA RARE (*)    All other components within normal limits  CBC WITH DIFFERENTIAL/PLATELET  PREGNANCY, URINE    Imaging Review No results found. I have personally reviewed and evaluated these images and lab results as part of my medical decision-making.  ED ECG REPORT   Date: 11/17/2015  Rate: 53  Rhythm: normal sinus rhythm  QRS Axis: normal  Intervals: normal  ST/T Wave abnormalities: nonspecific T wave changes in V2  Conduction Disutrbances:none  Narrative Interpretation:   Old EKG Reviewed: none available  I have personally reviewed the EKG tracing and agree with the computerized printout as noted.   MDM   Final diagnoses:  Peripheral vertigo, unspecified laterality   Patient is a 30 year old female with history of migraines that are currently not being treated with medication presents today with abrupt onset of vertiginous symptoms and right sided and central chest pain that started approximately 30 minutes prior to arrival while she was at the grocery store.  Patient states that she's been getting these dizzy spells intermittently for the last 6 months but today it was also associated with chest pain which is different. She also states that she is having blurry vision like a film over her eyes is also abnormal. She denies any unilateral weakness, numbness but does have some difficulty walking when she  has these episodes. No one has evaluated her for these which they are not associated with a headache after the start of symptoms. She does not have symptoms concerning for Mnire's disease. There are no strokelike symptoms and low suspicion the patient's chest pain today is related to ACS. Her EKG is within normal limits other than some borderline T wave abnormality in V2. Perc negative and here patient's heart rate is in the 50s and 60s with normal vital signs.  Patient's visual acuity was 20/20 in both eyes and 20/25 in each eye separately. She had a normal neurologic exam and no nystagmus. She did have a right middle ear effusion but otherwise exam was normal. Chest x-ray, CBC, UA and UPT are within normal limits. BMP to evaluate for diabetes or electrolyte abnormality pending. Patient given meclizine.  8:53 PM BMP wnl.  Pt's sx improved after meclizine and pt d/ced. I personally performed the services described in this documentation, which was scribed in my presence.  The recorded information has been reviewed and considered.     Gwyneth Sprout, MD 11/17/15 2053

## 2016-01-02 ENCOUNTER — Encounter (HOSPITAL_BASED_OUTPATIENT_CLINIC_OR_DEPARTMENT_OTHER): Payer: Self-pay | Admitting: Emergency Medicine

## 2016-01-02 ENCOUNTER — Emergency Department (HOSPITAL_BASED_OUTPATIENT_CLINIC_OR_DEPARTMENT_OTHER)
Admission: EM | Admit: 2016-01-02 | Discharge: 2016-01-02 | Disposition: A | Discharge status: Home / Self Care | Payer: BC Managed Care – PPO | Attending: Emergency Medicine | Admitting: Emergency Medicine

## 2016-01-02 DIAGNOSIS — Z8679 Personal history of other diseases of the circulatory system: Secondary | ICD-10-CM | POA: Insufficient documentation

## 2016-01-02 DIAGNOSIS — J111 Influenza due to unidentified influenza virus with other respiratory manifestations: Secondary | ICD-10-CM | POA: Diagnosis not present

## 2016-01-02 DIAGNOSIS — J029 Acute pharyngitis, unspecified: Secondary | ICD-10-CM | POA: Diagnosis present

## 2016-01-02 DIAGNOSIS — R69 Illness, unspecified: Secondary | ICD-10-CM

## 2016-01-02 NOTE — ED Notes (Signed)
Fever, sore throat, chills, body aches, headache, for 3 days.  No N/V/D.

## 2016-01-02 NOTE — Discharge Instructions (Signed)

## 2016-01-02 NOTE — ED Provider Notes (Signed)
CSN: [id number]     Arrival date & time 01/02/16  0830 History   First MD Initiated Contact with Patient 01/02/16 [phone]     Chief Complaint  Patient presents with  . Sore Throat     (Consider location/radiation/quality/duration/timing/severity/associated sxs/prior Treatment) HPI Comments: Patient is a 30 year old female with a past history of migrainous type headaches. She presents with flulike symptoms. She states the last 3 days, she's had runny nose, nasal congestion, fatigue, myalgias, and intermittent headaches. She's had fevers up to 103. Her last fever was yesterday. She denies any nausea vomiting or diarrhea. She denies any shortness of breath other than feeling extremely fatigued. She denies any chest pain. She does have a cough which is nonproductive. She is here with her boyfriend who has similar symptoms. She's been using over-the-counter medicines with some relief in symptoms.  Patient is a 30 y.o. female presenting with pharyngitis.  Sore Throat Pertinent negatives include no chest pain, no abdominal pain, no headaches and no shortness of breath.    Past Medical History  Diagnosis Date  . Medical history non-contributory   . Headache(784.0)   . H/O seasonal allergies   . Migraine    Past Surgical History  Procedure Laterality Date  . No past surgeries    . Cesarean section N/A 04/30/2013    Procedure: CESAREAN SECTION;  Surgeon: Kirkland Hun, MD;  Location: [facility];  Service: Obstetrics;  Laterality: N/A;   No family history on file. Social History  Substance Use Topics  . Smoking status: Never Smoker   . Smokeless tobacco: Never Used  . Alcohol Use: Yes     Comment: occ   OB History    Gravida Para Term Preterm AB TAB SAB Ectopic Multiple Living   Review of Systems  Constitutional: Positive for activity change, appetite change and fatigue. Negative for fever, chills and diaphoresis.  HENT: Positive for congestion, postnasal drip,  rhinorrhea and sore throat. Negative for sneezing.   Eyes: Negative.   Respiratory: Positive for cough. Negative for chest tightness and shortness of breath.   Cardiovascular: Negative for chest pain and leg swelling.  Gastrointestinal: Negative for nausea, vomiting, abdominal pain, diarrhea and blood in stool.  Genitourinary: Negative for frequency, hematuria, flank pain and difficulty urinating.  Musculoskeletal: Positive for myalgias. Negative for back pain and arthralgias.  Skin: Negative for rash.  Neurological: Negative for dizziness, speech difficulty, weakness, numbness and headaches.      Allergies  Percocet  Home Medications   Prior to Admission medications   Medication Sig Start Date End Date Taking? Authorizing Provider  amitriptyline (ELAVIL) 10 MG tablet Take 1 tablet (10 mg total) by mouth at bedtime. 07/01/14  Yes Eulis Foster, FNP   BP 143/85 mmHg  Pulse 94  Temp(Src) 99.2 F (37.3 C) (Oral)  Resp 16  Ht  (1.626 m)  Wt 211 lb 9 oz (95.964 kg)  BMI 36.30 kg/m2  SpO2 100%  LMP 12/18/2015 Physical Exam  Constitutional: She is oriented to person, place, and time. She appears well-developed and well-nourished.  HENT:  Head: Normocephalic and atraumatic.  Right Ear: External ear normal.  Left Ear: External ear normal.  Mouth/Throat: Oropharynx is clear and moist. No oropharyngeal exudate.  Eyes: Pupils are equal, round, and reactive to light.  Neck: Normal range of motion. Neck supple.  Cardiovascular: Normal rate, regular rhythm and normal heart sounds.   Pulmonary/Chest: Effort  normal and breath sounds normal. No respiratory distress. She has no wheezes. She has no rales. She exhibits no tenderness.  Abdominal: Soft. Bowel sounds are normal. There is no tenderness. There is no rebound and no guarding.  Musculoskeletal: Normal range of motion. She exhibits no edema.  Lymphadenopathy:    She has no cervical adenopathy.  Neurological: She is alert and  oriented to person, place, and time.  Skin: Skin is warm and dry. No rash noted.  Psychiatric: She has a normal mood and affect.    ED Course  Procedures (including critical care time) Labs Review Labs Reviewed - No data to display  Imaging Review No results found. I have personally reviewed and evaluated these images and lab results as part of my medical decision-making.   EKG Interpretation None      MDM   Final diagnoses:  Influenza-like illness    Patient presents with influenza-like illness. I don't hear any evidence of pneumonia. Her vital signs are stable. She has no vomiting, diarrhea or suggestions of dehydration. She was discharged home in good condition. She was encouraged to use over-the-counter medicines for symptom control. She was advised to return here or follow-up with her PCP if her symptoms worsen or are not improving.    Rolan Bucco, MD 01/02/16 [phone]

## 2016-10-01 ENCOUNTER — Other Ambulatory Visit (HOSPITAL_BASED_OUTPATIENT_CLINIC_OR_DEPARTMENT_OTHER): Payer: Self-pay | Admitting: Osteopathic Medicine

## 2016-10-01 DIAGNOSIS — R1010 Upper abdominal pain, unspecified: Secondary | ICD-10-CM

## 2016-10-04 ENCOUNTER — Ambulatory Visit (HOSPITAL_BASED_OUTPATIENT_CLINIC_OR_DEPARTMENT_OTHER)
Admission: RE | Admit: 2016-10-04 | Discharge: 2016-10-04 | Disposition: A | Discharge status: Home / Self Care | Payer: 59 | Source: Ambulatory Visit | Attending: Osteopathic Medicine | Admitting: Osteopathic Medicine

## 2016-10-04 DIAGNOSIS — R109 Unspecified abdominal pain: Secondary | ICD-10-CM | POA: Diagnosis not present

## 2016-10-04 DIAGNOSIS — R1010 Upper abdominal pain, unspecified: Secondary | ICD-10-CM

## 2016-10-04 DIAGNOSIS — R197 Diarrhea, unspecified: Secondary | ICD-10-CM | POA: Insufficient documentation

## 2016-12-30 ENCOUNTER — Encounter (HOSPITAL_BASED_OUTPATIENT_CLINIC_OR_DEPARTMENT_OTHER): Payer: Self-pay | Admitting: Emergency Medicine

## 2016-12-30 ENCOUNTER — Emergency Department (HOSPITAL_BASED_OUTPATIENT_CLINIC_OR_DEPARTMENT_OTHER)
Admission: EM | Admit: 2016-12-30 | Discharge: 2016-12-30 | Disposition: A | Discharge status: Home / Self Care | Payer: Self-pay | Attending: Dermatology | Admitting: Dermatology

## 2016-12-30 ENCOUNTER — Emergency Department (HOSPITAL_BASED_OUTPATIENT_CLINIC_OR_DEPARTMENT_OTHER): Payer: Self-pay

## 2016-12-30 DIAGNOSIS — Z5321 Procedure and treatment not carried out due to patient leaving prior to being seen by health care provider: Secondary | ICD-10-CM | POA: Insufficient documentation

## 2016-12-30 DIAGNOSIS — Z79899 Other long term (current) drug therapy: Secondary | ICD-10-CM | POA: Insufficient documentation

## 2016-12-30 DIAGNOSIS — R079 Chest pain, unspecified: Secondary | ICD-10-CM | POA: Insufficient documentation

## 2016-12-30 NOTE — ED Notes (Signed)
Pt called x 1 for xray, answer

## 2016-12-30 NOTE — ED Notes (Signed)
Pt called again, no answer from lobby

## 2016-12-30 NOTE — ED Triage Notes (Signed)
Pt states chest pain x 3 weeks, pain worse today when breathing in.

## 2017-10-21 ENCOUNTER — Other Ambulatory Visit: Payer: Self-pay

## 2017-10-21 ENCOUNTER — Inpatient Hospital Stay (HOSPITAL_COMMUNITY)
Admission: AD | Admit: 2017-10-21 | Discharge: 2017-10-21 | Disposition: A | Discharge status: Home / Self Care | Payer: Managed Care, Other (non HMO) | Source: Ambulatory Visit | Attending: Obstetrics and Gynecology | Admitting: Obstetrics and Gynecology

## 2017-10-21 ENCOUNTER — Inpatient Hospital Stay (HOSPITAL_COMMUNITY): Payer: Managed Care, Other (non HMO)

## 2017-10-21 ENCOUNTER — Encounter (HOSPITAL_COMMUNITY): Payer: Self-pay | Admitting: *Deleted

## 2017-10-21 DIAGNOSIS — Z885 Allergy status to narcotic agent status: Secondary | ICD-10-CM | POA: Insufficient documentation

## 2017-10-21 DIAGNOSIS — R109 Unspecified abdominal pain: Secondary | ICD-10-CM | POA: Diagnosis present

## 2017-10-21 DIAGNOSIS — O0380 Unspecified complication following complete or unspecified spontaneous abortion: Secondary | ICD-10-CM

## 2017-10-21 DIAGNOSIS — R102 Pelvic and perineal pain: Secondary | ICD-10-CM | POA: Insufficient documentation

## 2017-10-21 LAB — POCT PREGNANCY, URINE: Preg Test, Ur: POSITIVE — AB

## 2017-10-21 LAB — URINALYSIS, ROUTINE W REFLEX MICROSCOPIC

## 2017-10-21 LAB — URINALYSIS, MICROSCOPIC (REFLEX): WBC, UA: NONE SEEN WBC/hpf (ref 0–5)

## 2017-10-21 MED ORDER — IBUPROFEN 800 MG PO TABS: 800.0000 mg | ORAL_TABLET | Freq: Three times a day (TID) | ORAL | 0 refills | Status: DC

## 2017-10-21 MED ORDER — KETOROLAC TROMETHAMINE 60 MG/2ML IM SOLN
60.0000 mg | Freq: Once | INTRAMUSCULAR | Status: AC
  Administered 2017-10-21: 60 mg via INTRAMUSCULAR
  Filled 2017-10-21: qty 2

## 2017-10-21 MED ORDER — METHYLERGONOVINE MALEATE 0.2 MG PO TABS
0.2000 mg | ORAL_TABLET | Freq: Four times a day (QID) | ORAL | Status: DC
  Administered 2017-10-21: 0.2 mg via ORAL
  Filled 2017-10-21: qty 1

## 2017-10-21 NOTE — MAU Note (Signed)
Pt presents with c/o abdominal & pelvic pain since 10/16/2017.  Pt states she had an elective abortion on 10/14/2017.   Reports VB, no heavier than a menstrual period.

## 2017-10-21 NOTE — Discharge Instructions (Signed)
Nothing for vagina for 2-3 weeks. F/u with [place] [facility].abstain until reliable birth control started

## 2017-10-21 NOTE — [facility] Provider Note (Signed)
History     Chief Complaint  Patient presents with  . Abdominal Pain  . Pelvic Pain   31 yo G3P1021 BF presents with abdominal/pelvic pain since having TAB last Thursday. Pt had surgical termination at ~ 6 1/2 week. Pt notes 2 days post procedure she began having pain and passage of clots. Denies fever or malodorous d/c. Pt self medicated with flagyl x 1 dose. Pt has not taken any Motrin since Sunday( ran out of med). Pain is interemittent crampy associated with movement, laughing. Nonradiating. No assoc n/v.  OB History    Gravida Para Term Preterm AB Living   3 1 1   2 1    SAB TAB Ectopic Multiple Live Births     1     1      Past Medical History:  Diagnosis Date  . H/O seasonal allergies   . Headache(784.0)   . Medical history non-contributory   . Migraine     Past Surgical History:  Procedure Laterality Date  . CESAREAN SECTION N/A 04/30/2013   Procedure: CESAREAN SECTION;  Surgeon: Kirkland HunArthur Stringer, MD;  Location: [facility];  Service: Obstetrics;  Laterality: N/A;  . NO PAST SURGERIES      History reviewed. No pertinent family history.  Social History   Tobacco Use  . Smoking status: Never Smoker  . Smokeless tobacco: Never Used  Substance Use Topics  . Alcohol use: Yes    Comment: occ  . Drug use: No    Allergies:  Allergies  Allergen Reactions  . Percocet [Oxycodone-Acetaminophen] Itching    Medications Prior to Admission  Medication Sig Dispense Refill Last Dose  . amitriptyline (ELAVIL) 10 MG tablet Take 1 tablet (10 mg total) by mouth at bedtime. 30 tablet 3 Past Week at Unknown time  . omeprazole (PRILOSEC) 10 MG capsule Take 10 mg by mouth daily.        Physical Exam   Blood pressure 130/89, pulse 80, temperature 98.3 F (36.8 C), temperature source Oral, resp. rate 18, height 5\' 3"  (1.6 m), weight 94.8 kg (209 lb), last menstrual period 08/15/2017, SpO2 98 %, unknown if currently breastfeeding.  General appearance: alert, cooperative and no  distress Abdomen: soft, non-tender; bowel sounds normal; no masses,  no organomegaly Pelvic: cervix normal in appearance, external genitalia normal, no adnexal masses or tenderness, no bladder tenderness, no cervical motion tenderness, uterus normal size, shape, and consistency and cervix with clots at os, (+) blood in vault. adnexa nontender Extremities: no edema, redness or tenderness in the calves or thighs Skin: Skin color, texture, turgor normal. No rashes or lesions   IMP: pelvic pain S/p TAB P) pelvic sonogram r/o retained POC  ED Course  .adeendum:  [place] Ob Comp Less 14 Wks  Result Date: 10/21/2017 CLINICAL DATA:  Therapeutic abortion 10/14/2017. Severe abdominopelvic pain and vaginal bleeding since 10/16/2017. EXAM: OBSTETRIC <14 WK US AND TRANSVAGINAL OB US TECHNIQUE: Both transabdominal and transvaginal ultrasound examinations were performed for complete evaluation of the gestation as well as the maternal uterus, adnexal regions, and pelvic cul-de-sac. Transvaginal technique was performed to assess early pregnancy. COMPARISON:  None. FINDINGS: No intra or extrauterine gestational sac is seen. Mildly heterogeneous endometrium measuring up to 6 mm, without focal masslike area. Small volume fluid in the endocervical and endometrial canal that is nonspecific and presumably related to history of bleeding. Symmetric negative ovaries. No adnexal mass. No pelvic fluid. IMPRESSION: Mild heterogeneity of the endometrium without mass to suggest retained products. Endometrial stripe measures  6 mm. Normal adnexa.  No pelvic fluid. Electronically Signed   By: Marnee SpringJonathon  Watts M.D.   On: 10/21/2017 18:17   [place] Ob Transvaginal  Result Date: 10/21/2017 CLINICAL DATA:  Therapeutic abortion 10/14/2017. Severe abdominopelvic pain and vaginal bleeding since 10/16/2017. EXAM: OBSTETRIC <14 WK US AND TRANSVAGINAL OB US TECHNIQUE: Both transabdominal and transvaginal ultrasound examinations were performed for  complete evaluation of the gestation as well as the maternal uterus, adnexal regions, and pelvic cul-de-sac. Transvaginal technique was performed to assess early pregnancy. COMPARISON:  None. FINDINGS: No intra or extrauterine gestational sac is seen. Mildly heterogeneous endometrium measuring up to 6 mm, without focal masslike area. Small volume fluid in the endocervical and endometrial canal that is nonspecific and presumably related to history of bleeding. Symmetric negative ovaries. No adnexal mass. No pelvic fluid. IMPRESSION: Mild heterogeneity of the endometrium without mass to suggest retained products. Endometrial stripe measures 6 mm. Normal adnexa.  No pelvic fluid. Electronically Signed   By: Marnee SpringJonathon  Watts M.D.   On: 10/21/2017 18:17  IMP: postabortal syndrome P) IM toradol. Methergine . Advised to f/u with TAB clinic Will not be able to send pt home with additional methergine due to her BP now MDM   Serita KyleSheronette A Ayako Tapanes, MD 6:53 PM 10/21/2017

## 2018-08-19 DIAGNOSIS — N390 Urinary tract infection, site not specified: Secondary | ICD-10-CM | POA: Diagnosis not present

## 2018-08-19 DIAGNOSIS — R110 Nausea: Secondary | ICD-10-CM | POA: Diagnosis not present

## 2018-08-19 DIAGNOSIS — Z6837 Body mass index (BMI) 37.0-37.9, adult: Secondary | ICD-10-CM | POA: Diagnosis not present

## 2018-08-19 DIAGNOSIS — R42 Dizziness and giddiness: Secondary | ICD-10-CM | POA: Diagnosis not present

## 2018-09-24 DIAGNOSIS — N760 Acute vaginitis: Secondary | ICD-10-CM | POA: Diagnosis not present

## 2018-10-16 DIAGNOSIS — J00 Acute nasopharyngitis [common cold]: Secondary | ICD-10-CM | POA: Diagnosis not present

## 2018-11-06 ENCOUNTER — Ambulatory Visit
Admission: RE | Admit: 2018-11-06 | Discharge: 2018-11-06 | Disposition: A | Discharge status: Home / Self Care | Payer: BC Managed Care – PPO | Source: Ambulatory Visit | Attending: Nurse Practitioner | Admitting: Nurse Practitioner

## 2018-11-06 ENCOUNTER — Other Ambulatory Visit: Payer: Self-pay | Admitting: Nurse Practitioner

## 2018-11-06 DIAGNOSIS — T1490XA Injury, unspecified, initial encounter: Secondary | ICD-10-CM

## 2018-11-06 DIAGNOSIS — M25571 Pain in right ankle and joints of right foot: Secondary | ICD-10-CM | POA: Diagnosis not present

## 2018-11-06 DIAGNOSIS — S99911A Unspecified injury of right ankle, initial encounter: Secondary | ICD-10-CM | POA: Diagnosis not present

## 2018-11-30 DIAGNOSIS — M79642 Pain in left hand: Secondary | ICD-10-CM | POA: Diagnosis not present

## 2018-11-30 DIAGNOSIS — M25571 Pain in right ankle and joints of right foot: Secondary | ICD-10-CM | POA: Diagnosis not present

## 2018-11-30 DIAGNOSIS — M79641 Pain in right hand: Secondary | ICD-10-CM | POA: Diagnosis not present

## 2018-12-11 DIAGNOSIS — N760 Acute vaginitis: Secondary | ICD-10-CM | POA: Diagnosis not present

## 2019-01-19 DIAGNOSIS — Z01419 Encounter for gynecological examination (general) (routine) without abnormal findings: Secondary | ICD-10-CM | POA: Diagnosis not present

## 2019-01-19 DIAGNOSIS — Z6838 Body mass index (BMI) 38.0-38.9, adult: Secondary | ICD-10-CM | POA: Diagnosis not present

## 2019-02-25 DIAGNOSIS — M25511 Pain in right shoulder: Secondary | ICD-10-CM | POA: Diagnosis not present

## 2019-02-25 DIAGNOSIS — M542 Cervicalgia: Secondary | ICD-10-CM | POA: Diagnosis not present

## 2019-02-25 DIAGNOSIS — M25512 Pain in left shoulder: Secondary | ICD-10-CM | POA: Diagnosis not present

## 2019-06-21 ENCOUNTER — Encounter

## 2019-06-21 ENCOUNTER — Ambulatory Visit (INDEPENDENT_AMBULATORY_CARE_PROVIDER_SITE_OTHER): Payer: 59 | Admitting: Family Medicine

## 2019-06-21 ENCOUNTER — Other Ambulatory Visit: Payer: Self-pay

## 2019-06-21 ENCOUNTER — Encounter: Payer: Self-pay | Admitting: Family Medicine

## 2019-06-21 VITALS — BP 122/84 | HR 81 | Temp 98.4°F | Resp 16 | Ht 64.0 in | Wt 204.8 lb

## 2019-06-21 DIAGNOSIS — G43009 Migraine without aura, not intractable, without status migrainosus: Secondary | ICD-10-CM | POA: Diagnosis not present

## 2019-06-21 DIAGNOSIS — E669 Obesity, unspecified: Secondary | ICD-10-CM | POA: Diagnosis not present

## 2019-06-21 DIAGNOSIS — F430 Acute stress reaction: Secondary | ICD-10-CM | POA: Diagnosis not present

## 2019-06-21 DIAGNOSIS — G44209 Tension-type headache, unspecified, not intractable: Secondary | ICD-10-CM | POA: Diagnosis not present

## 2019-06-21 MED ORDER — AMITRIPTYLINE HCL 25 MG PO TABS: 25.0000 mg | ORAL_TABLET | Freq: Every day | ORAL | 1 refills | Status: DC

## 2019-06-21 MED ORDER — CLONAZEPAM 0.5 MG PO TABS: 0.5000 mg | ORAL_TABLET | Freq: Every day | ORAL | 0 refills | Status: DC | PRN

## 2019-06-21 NOTE — Patient Instructions (Signed)
It was so good seeing you again! Thank you for establishing with my new practice and allowing me to continue caring for you. It means a lot to me.   Please schedule a follow up appointment with me in 4 weeks to recheck headaches and stress.  Use the medications as directed.  Work on being mindful and present and positive.    Stress Stress is a normal reaction to life events. Stress is what you feel when life demands more than you are used to, or more than you think you can handle. Some stress can be useful, such as studying for a test or meeting a deadline at work. Stress that occurs too often or for too long can cause problems. It can affect your emotional health and interfere with relationships and normal daily activities. Too much stress can weaken your body's defense system (immune system) and increase your risk for physical illness. If you already have a medical problem, stress can make it worse. What are the causes? All sorts of life events can cause stress. An event that causes stress for one person may not be stressful for another person. Major life events, whether positive or negative, commonly cause stress. Examples include:  Losing a job or starting a new job.  Losing a loved one.  Moving to a new town or home.  Getting married or divorced.  Having a baby.  Injury or illness. Less obvious life events can also cause stress, especially if they occur day after day or in combination with each other. Examples include:  Working long hours.  Driving in traffic.  Caring for children.  Being in debt.  Being in a difficult relationship. What are the signs or symptoms? Stress can cause emotional symptoms, including:  Anxiety. This is feeling worried, afraid, on edge, overwhelmed, or out of control.  Anger, including irritation or impatience.  Depression. This is feeling sad, down, helpless, or guilty.  Trouble focusing, remembering, or making decisions. Stress can cause  physical symptoms, including:  Aches and pains. These may affect your head, neck, back, stomach, or other areas of your body.  Tight muscles or a clenched jaw.  Low energy.  Trouble sleeping. Stress can cause unhealthy behaviors, including:  Eating to feel better (overeating) or skipping meals.  Working too much or putting off tasks.  Smoking, drinking alcohol, or using drugs to feel better. How is this diagnosed? Stress is diagnosed through an assessment by your health care provider. He or she may diagnose this condition based on:  Your symptoms and any stressful life events.  Your medical history.  Tests to rule out other causes of your symptoms. Depending on your condition, your health care provider may refer you to a specialist for further evaluation. How is this treated?  Stress management techniques are the recommended treatment for stress. Medicine is not typically recommended for the treatment of stress. Techniques to reduce your reaction to stressful life events include:  Stress identification. Monitor yourself for symptoms of stress and identify what causes stress for you. These skills may help you to avoid or prepare for stressful events.  Time management. Set your priorities, keep a calendar of events, and learn to say "no." Taking these actions can help you avoid making too many commitments. Techniques for coping with stress include:  Rethinking the problem. Try to think realistically about stressful events rather than ignoring them or overreacting. Try to find the positives in a stressful situation rather than focusing on the negatives.  Exercise.  Physical exercise can release both physical and emotional tension. The key is to find a form of exercise that you enjoy and do it regularly.  Relaxation techniques. These relax the body and mind. The key is to find one or more that you enjoy and use the technique(s) regularly. Examples include: ? Meditation, deep  breathing, or progressive relaxation techniques. ? Yoga or tai chi. ? Biofeedback, mindfulness techniques, or journaling. ? Listening to music, being out in nature, or participating in other hobbies.  Practicing a healthy lifestyle. Eat a balanced diet, drink plenty of water, limit or avoid caffeine, and get plenty of sleep.  Having a strong support network. Spend time with family, friends, or other people you enjoy being around. Express your feelings and talk things over with someone you trust. Counseling or talk therapy with a mental health professional may be helpful if you are having trouble managing stress on your own. Follow these instructions at home: Lifestyle   Avoid drugs.  Do not use any products that contain nicotine or tobacco, such as cigarettes and e-cigarettes. If you need help quitting, ask your health care provider.  Limit alcohol intake to no more than 1 drink a day for nonpregnant women and 2 drinks a day for men. One drink equals 12 oz of beer, 5 oz of wine, or 1 oz of hard liquor.  Do not use alcohol or drugs to relax.  Eat a balanced diet that includes fresh fruits and vegetables, whole grains, lean meats, fish, eggs, and beans, and low-fat dairy. Avoid processed foods and foods high in added fat, sugar, and salt.  Exercise at least 30 minutes on 5 or more days each week.  Get 7-8 hours of sleep each night. General instructions   Practice stress management techniques as discussed with your health care provider.  Drink enough fluid to keep your urine clear or pale yellow.  Take over-the-counter and prescription medicines only as told by your health care provider.  Keep all follow-up visits as told by your health care provider. This is important. Contact a health care provider if:  Your symptoms get worse.  You have new symptoms.  You feel overwhelmed by your problems and can no longer manage them on your own. Get help right away if:  You have  thoughts of hurting yourself or others. If you ever feel like you may hurt yourself or others, or have thoughts about taking your own life, get help right away. You can go to your nearest emergency department or call:  Your local emergency services (911 in the [place]  A suicide crisis helpline, such as the [facility] at 240 542 2755. This is open 24 hours a day. Summary  Stress is a normal reaction to life events. It can cause problems if it happens too often or for too long.  Practicing stress management techniques is the best way to treat stress.  Counseling or talk therapy with a mental health professional may be helpful if you are having trouble managing stress on your own. This information is not intended to replace advice given to you by your health care provider. Make sure you discuss any questions you have with your health care provider. Document Released: 04/23/2001 Document Revised: 10/10/2017 Document Reviewed: 12/18/2016 Elsevier Patient Education  2020 [employer].
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
## 2019-06-23 ENCOUNTER — Ambulatory Visit: Payer: Self-pay

## 2019-06-23 ENCOUNTER — Ambulatory Visit (INDEPENDENT_AMBULATORY_CARE_PROVIDER_SITE_OTHER): Payer: 59 | Admitting: Family Medicine

## 2019-06-23 ENCOUNTER — Other Ambulatory Visit: Payer: Self-pay

## 2019-06-23 ENCOUNTER — Encounter: Payer: Self-pay | Admitting: Family Medicine

## 2019-06-23 DIAGNOSIS — R519 Headache, unspecified: Secondary | ICD-10-CM

## 2019-06-23 DIAGNOSIS — R51 Headache: Secondary | ICD-10-CM

## 2019-06-23 DIAGNOSIS — M2550 Pain in unspecified joint: Secondary | ICD-10-CM | POA: Diagnosis not present

## 2019-06-23 NOTE — Telephone Encounter (Signed)
Message from [facility] sent at 06/23/2019 10:18 AM EDT  Pt stated she was prescribed Bactrim by her Gynecologist and she is experiencing migraines,neck stiffness, and joint pain. Pt stated she contacted the Gynecologist office and she was told that it sounded like Covid and she needs to contact her pcp. Pt requests call back.    Reason for Disposition . [1] Caller has NON-URGENT medication question about med that PCP prescribed AND [2] triager unable to answer question    Bactrim prescribed by Gynecologist for UTI; was advised to call PCP for evaluation of symptoms.  Answer Assessment - Initial Assessment Questions 1.   NAME of MEDICATION: "What medicine are you calling about?"     Sulfmethoxazole-Trimethroprim  800/160 mg BID; started on 06/21/19 x 2 doses; last dose 10:00 PM 8/10 2.   QUESTION: "What is your question?"     Poss. Medication reaction 3.   PRESCRIBING HCP: "Who prescribed it?" Reason: if prescribed by specialist, call should be referred to that group.     Gynecologist 4. SYMPTOMS: "Do you have any symptoms?"     Onset of constant headache, post. Neck stiffness, ankle and wrist pain on Tuesday, 8/11; denied rash, denied swelling of lips, tongue, or throat.  Denied SOB.  5. SEVERITY: If symptoms are present, ask "Are they mild, moderate or severe?"    Moderate  6.  PREGNANCY:  "Is there any chance that you are pregnant?" "When was your last menstrual period?"     LMP July 27; does not feel she is pregnant  Protocols used: MEDICATION QUESTION CALL-A-AH  Returned call to pt.  C/o onset of "migraine" headache, posterior neck stiffness and ankle and wrist pain on 06/22/19.  Reported she started Bactrim DS 800/160 mg bid on 8/10; took 2 doses of Bactrim on 8/10 and her symptoms began on 8/11.  Reported this to Gynecologist that prescribed Bactrim, and advised to call PCP, due to poss. COVID symptoms.  Denied any known exposure to COVID positive individual.  Denied fever.  C/o  chills yesterday.  Denied cough or shortness of breath.  Denied chest tightness.  Reported she has tried [employer], Advil, and Ibuprofen for her symptoms, without relief.  Advised will schedule for appt. With PCP for further evaluation.  Pt. Agreed with plan.

## 2019-06-23 NOTE — Telephone Encounter (Signed)
Noted  

## 2019-06-23 NOTE — Progress Notes (Signed)
Virtual Visit via Video Note  Subjective  CC:  Chief Complaint  Patient presents with  . Medication Reaction    started Bactrum 8/10.Marland Kitchen Reports that 8/11 she started having insomnia, headaches, joint pain, and chills.. She has stopped medication and reports she is still having HAs and joint pain.. She has tried [facility] powder, IBU, and Aleve     I connected with NATHALI VENT on 06/23/19 at  3:40 PM EDT by a video enabled telemedicine application and verified that I am speaking with the correct person using two identifiers. Location patient: Home Location provider: [facility] [employer], Office Persons participating in the virtual visit: Lance Muss, MD Lilli Light, [place] discussed the limitations of evaluation and management by telemedicine and the availability of in person appointments. The patient expressed understanding and agreed to proceed. HPI: [name] is a 33 y.o. female who was contacted today to address the problems listed above in the chief complaint. . See triage note. . 8/10 had visit with gyn and dxd with symptomatic uti and started bactrim. Took 2 doses. Awoke next day with arthralgias, myalgias and headach, bitemporal. No f/c/s, uri sxs, GI sxs, loss of taste or smell, sob, cough or rash. Stopped bactrim. Feels 80% better but still with mild headache and some arthralgia. Gyn changed to macrobid for uti that is resolving. No covid exposures.  . Migraines: headache is not typical of migraine.   Assessment  1. Acute intractable headache, unspecified headache type   2. Arthralgia, unspecified joint      Plan   sxs due to new viral cause, atypical reaction to bactrim or other:  Discussed possibilities in details. She appears well and is improving. rec monitoring over the next 24 -72 hours. F/u with me if worsens or persists.  I discussed the assessment and treatment plan with the patient. The patient was provided an  opportunity to ask questions and all were answered. The patient agreed with the plan and demonstrated an understanding of the instructions.   The patient was advised to call back or seek an in-person evaluation if the symptoms worsen or if the condition fails to improve as anticipated. Follow up: No follow-ups on file.  07/21/2019  No orders of the defined types were placed in this encounter.     I reviewed the patients updated PMH, FH, and SocHx.    Patient Active Problem List   Diagnosis Date Noted  . Obesity (BMI 30-39.9) 06/21/2019  . Tension headache 06/21/2019  . Stress reaction 06/21/2019  . Family history of colon cancer 05/05/2015  . Menorrhagia with regular cycle 05/05/2015  . Gastroesophageal reflux disease 05/05/2015  . Seasonal allergic rhinitis 02/17/2015  . Migraine without aura and without status migrainosus, not intractable 07/01/2014   Current Meds  Medication Sig  . nitrofurantoin, macrocrystal-monohydrate, (MACROBID) 100 MG capsule Take 100 mg by mouth 2 (two) times daily.    Allergies: Patient is allergic to percocet [oxycodone-acetaminophen]. Family History: Patient family history includes Colon cancer in her maternal grandmother; Healthy in her mother and son. Social History:  Patient  reports that she has never smoked. She has never used smokeless tobacco. She reports current alcohol use. She reports that she does not use drugs.  Review of Systems: Constitutional: Negative for fever malaise or anorexia Cardiovascular: negative for chest pain Respiratory: negative for SOB or persistent cough Gastrointestinal: negative for abdominal pain  OBJECTIVE Vitals: LMP 06/06/2019  General: no acute distress , A&Ox3, appears well  Willow Oraamille L Andy, MD

## 2019-06-23 NOTE — Telephone Encounter (Signed)
See note

## 2019-06-25 ENCOUNTER — Other Ambulatory Visit: Payer: Self-pay

## 2019-06-25 DIAGNOSIS — Z20822 Contact with and (suspected) exposure to covid-19: Secondary | ICD-10-CM

## 2019-06-27 LAB — NOVEL CORONAVIRUS, NAA: SARS-CoV-2, NAA: NOT DETECTED

## 2019-07-21 ENCOUNTER — Ambulatory Visit: Payer: 59 | Admitting: Family Medicine

## 2019-10-12 DIAGNOSIS — U071 COVID-19: Secondary | ICD-10-CM

## 2019-10-12 HISTORY — DX: COVID-19: U07.1

## 2019-12-21 ENCOUNTER — Other Ambulatory Visit: Payer: Self-pay | Admitting: Nurse Practitioner

## 2019-12-21 ENCOUNTER — Ambulatory Visit
Admission: RE | Admit: 2019-12-21 | Discharge: 2019-12-21 | Disposition: A | Discharge status: Home / Self Care | Payer: Self-pay | Source: Ambulatory Visit | Attending: Nurse Practitioner | Admitting: Nurse Practitioner

## 2019-12-21 ENCOUNTER — Other Ambulatory Visit: Payer: Self-pay

## 2019-12-22 ENCOUNTER — Ambulatory Visit (INDEPENDENT_AMBULATORY_CARE_PROVIDER_SITE_OTHER)
Admission: RE | Admit: 2019-12-22 | Discharge: 2019-12-22 | Disposition: A | Discharge status: Home / Self Care | Payer: 59 | Source: Ambulatory Visit

## 2019-12-22 DIAGNOSIS — N898 Other specified noninflammatory disorders of vagina: Secondary | ICD-10-CM | POA: Diagnosis not present

## 2019-12-22 MED ORDER — CLINDAMYCIN HCL 150 MG PO CAPS: 300.0000 mg | ORAL_CAPSULE | Freq: Two times a day (BID) | ORAL | 0 refills | Status: AC

## 2019-12-22 NOTE — Discharge Instructions (Signed)
Treating you for BV.  Follow up with your OB/GYN as needed

## 2019-12-22 NOTE — ED Provider Notes (Signed)
Virtual Visit via Video Note:  [name]  initiated request for Telemedicine visit with [facility] team. I connected with [name]  on 12/22/2019 at 8:25 AM  for a synchronized telemedicine visit using a video enabled HIPPA compliant telemedicine application. I verified that I am speaking with [name]  using two identifiers. Janace Aris, NP  was physically located in a [facility] Urgent care site and [place] was located at a different location.   The limitations of evaluation and management by telemedicine as well as the availability of in-person appointments were discussed. Patient was informed that she  may incur a bill ( including co-pay) for this virtual visit encounter. [name]  expressed understanding and gave verbal consent to proceed with virtual visit.     History of Present Illness:[name]  is a 34 y.o. female presents with vaginal discharge. She has just finished  her menstrual cycle. Hx of recurrent BV. She has been taking a probiotic and is currently on birth control. Symptoms started yesterday. No vaginal itching. No concern for STDs. No abd pain ,back pain, fever, or trouble with urination.    Past Medical History:  Diagnosis Date  . H/O seasonal allergies   . Migraine     Allergies  Allergen Reactions  . Percocet [Oxycodone-Acetaminophen] Itching        Observations/Objective: VITALS: Per patient if applicable, see vitals. GENERAL: Alert, appears well and in no acute distress. HEENT: Atraumatic, conjunctiva clear, no obvious abnormalities on inspection of external nose and ears. NECK: Normal movements of the head and neck. CARDIOPULMONARY: No increased WOB. Speaking in clear sentences. I:E ratio WNL.  MS: Moves all visible extremities without noticeable abnormality. PSYCH: Pleasant and cooperative, well-groomed. Speech normal rate and rhythm. Affect is appropriate. Insight and judgement are appropriate.  Attention is focused, linear, and appropriate.  NEURO: CN grossly intact. Oriented as arrived to appointment on time with no prompting. Moves both UE equally.  SKIN: No obvious lesions, wounds, erythema, or cyanosis noted on face or hands.     Assessment and Plan: Treating for BV based on symptoms and history.    Follow Up Instructions:Follow up as needed for continued or worsening symptoms     I discussed the assessment and treatment plan with the patient. The patient was provided an opportunity to ask questions and all were answered. The patient agreed with the plan and demonstrated an understanding of the instructions.   The patient was advised to call back or seek an in-person evaluation if the symptoms worsen or if the condition fails to improve as anticipated.    Janace Aris, NP  12/22/2019 8:25 AM         Janace Aris, NP 12/22/19 0840

## 2020-01-21 ENCOUNTER — Ambulatory Visit: Payer: 59 | Attending: Internal Medicine

## 2020-01-21 DIAGNOSIS — Z23 Encounter for immunization: Secondary | ICD-10-CM

## 2020-01-21 NOTE — Progress Notes (Signed)
   Covid-19 Vaccination Clinic  Name:  [name]    MRN: [id number] DOB: September 26, 1986  01/21/2020  [name] was observed post Covid-19 immunization for 15 minutes without incident. She was provided with Vaccine Information Sheet and instruction to access the V-Safe system.   [name] was instructed to call 911 with any severe reactions post vaccine: Marland Kitchen Difficulty breathing  . Swelling of face and throat  . A fast heartbeat  . A bad rash all over body  . Dizziness and weakness   Immunizations Administered    Name Date Dose VIS Date Route   Pfizer COVID-19 Vaccine 01/21/2020  9:20 AM 0.3 mL 10/22/2019 Intramuscular   Manufacturer: [employer], [employer]   Lot: [id number]   NDC: [address]-9

## 2020-02-14 ENCOUNTER — Ambulatory Visit: Payer: 59 | Attending: Internal Medicine

## 2020-02-14 DIAGNOSIS — Z23 Encounter for immunization: Secondary | ICD-10-CM

## 2020-02-14 NOTE — Progress Notes (Signed)
   Covid-19 Vaccination Clinic  Name:  [name]    MRN: [id number] DOB: 10/14/1986  02/14/2020  [name] was observed post Covid-19 immunization for 15 minutes without incident. She was provided with Vaccine Information Sheet and instruction to access the V-Safe system.   [name] was instructed to call 911 with any severe reactions post vaccine: Marland Kitchen Difficulty breathing  . Swelling of face and throat  . A fast heartbeat  . A bad rash all over body  . Dizziness and weakness   Immunizations Administered    Name Date Dose VIS Date Route   Pfizer COVID-19 Vaccine 02/14/2020 10:04 AM 0.3 mL 10/22/2019 Intramuscular   Manufacturer: [employer], [employer]   Lot: [id number]   NDC: [address]-4

## 2020-05-04 ENCOUNTER — Ambulatory Visit (INDEPENDENT_AMBULATORY_CARE_PROVIDER_SITE_OTHER)
Admission: RE | Admit: 2020-05-04 | Discharge: 2020-05-04 | Disposition: A | Discharge status: Home / Self Care | Payer: 59 | Source: Ambulatory Visit

## 2020-05-04 ENCOUNTER — Other Ambulatory Visit: Payer: Self-pay

## 2020-05-04 DIAGNOSIS — N898 Other specified noninflammatory disorders of vagina: Secondary | ICD-10-CM | POA: Diagnosis not present

## 2020-05-04 MED ORDER — CLINDAMYCIN HCL 300 MG PO CAPS: 300.0000 mg | ORAL_CAPSULE | Freq: Three times a day (TID) | ORAL | 0 refills | Status: DC

## 2020-05-04 NOTE — ED Provider Notes (Signed)
Virtual Visit via Video Note:  [name]  initiated request for Telemedicine visit with [facility] Urgent [facility] team. I connected with [name]  on 05/04/2020 at 1:10 PM  for a synchronized telemedicine visit using a video enabled HIPPA compliant telemedicine application. I verified that I am speaking with [name]  using two identifiers. Sharion Balloon, NP  was physically located in a [facility] Urgent care site and [place] was located at a different location.   The limitations of evaluation and management by telemedicine as well as the availability of in-person appointments were discussed. Patient was informed that she  may incur a bill ( including co-pay) for this virtual visit encounter. [name]  expressed understanding and gave verbal consent to proceed with virtual visit.     History of Present Illness:[name]  is a 34 y.o. female presents for evaluation of thin white vaginal discharge x 2 days.  History of recurrent BV and her current symptoms are typical of her usual BV.  She is followed by OB/GYN for this but could not get an appointment until next week.  She denies fever, chills, abdominal pain, dysuria, pelvic pain, back pain, rash, lesions, or other symptoms.  She states she is unable to take oral metronidazole due it gives her an awful taste in her mouth.  She denies current pregnancy or breastfeeding.      Allergies  Allergen Reactions  . Percocet [Oxycodone-Acetaminophen] Itching     Past Medical History:  Diagnosis Date  . H/O seasonal allergies   . Migraine      Social History   Tobacco Use  . Smoking status: Never Smoker  . Smokeless tobacco: Never Used  Vaping Use  . Vaping Use: Never used  Substance Use Topics  . Alcohol use: Yes    Comment: occ  . Drug use: No   ROS: as stated in HPI.  All other systems reviewed and negative.      Observations/Objective: Physical Exam  VITALS: Patient denies fever. GENERAL:  Alert, appears well and in no acute distress. HEENT: Atraumatic. Oral mucosa appears moist. NECK: Normal movements of the head and neck. CARDIOPULMONARY: No increased WOB. Speaking in clear sentences. I:E ratio WNL.  MS: Moves all visible extremities without noticeable abnormality. PSYCH: Pleasant and cooperative, well-groomed. Speech normal rate and rhythm. Affect is appropriate. Insight and judgement are appropriate. Attention is focused, linear, and appropriate.  NEURO: CN grossly intact. Oriented as arrived to appointment on time with no prompting. Moves both UE equally.  SKIN: No obvious lesions, wounds, erythema, or cyanosis noted on face or hands.   Assessment and Plan:    ICD-10-CM   1. Vaginal discharge  N89.8        Follow Up Instructions: Treating with clindamycin as patient reports difficulty with metronidazole.  Instructed her to follow up with her PCP or OB/GYN or come her to be seen in person if her symptoms are not improving.  Patient agrees to plan of care.      I discussed the assessment and treatment plan with the patient. The patient was provided an opportunity to ask questions and all were answered. The patient agreed with the plan and demonstrated an understanding of the instructions.   The patient was advised to call back or seek an in-person evaluation if the symptoms worsen or if the condition fails to improve as anticipated.      Sharion Balloon, NP  05/04/2020  1:10 PM         Mickie Bail, NP 05/04/20 1310

## 2020-05-04 NOTE — Discharge Instructions (Signed)
Take the clindamycin as directed.    Follow up with your primary care provider or OB/GYN or come here to be seen in person if your symptoms are not improving.
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
## 2020-07-25 ENCOUNTER — Ambulatory Visit: Payer: 59 | Admitting: Neurology

## 2020-07-25 ENCOUNTER — Other Ambulatory Visit: Payer: Self-pay

## 2020-07-25 ENCOUNTER — Encounter: Payer: Self-pay | Admitting: Neurology

## 2020-07-25 VITALS — BP 121/81 | HR 61 | Ht 63.0 in | Wt 208.0 lb

## 2020-07-25 DIAGNOSIS — H539 Unspecified visual disturbance: Secondary | ICD-10-CM

## 2020-07-25 DIAGNOSIS — R519 Headache, unspecified: Secondary | ICD-10-CM | POA: Diagnosis not present

## 2020-07-25 DIAGNOSIS — H532 Diplopia: Secondary | ICD-10-CM

## 2020-07-25 DIAGNOSIS — G43711 Chronic migraine without aura, intractable, with status migrainosus: Secondary | ICD-10-CM | POA: Diagnosis not present

## 2020-07-25 DIAGNOSIS — R51 Headache with orthostatic component, not elsewhere classified: Secondary | ICD-10-CM | POA: Diagnosis not present

## 2020-07-25 MED ORDER — AJOVY 225 MG/1.5ML ~~LOC~~ SOAJ: 225.0000 mg | SUBCUTANEOUS | 0 refills | Status: DC

## 2020-07-25 MED ORDER — UBRELVY 100 MG PO TABS: 100.0000 mg | ORAL_TABLET | ORAL | 6 refills | Status: DC | PRN

## 2020-07-25 NOTE — Patient Instructions (Signed)
Preventative: start Ajovy Preventative: Start Botox Acute: Bernita Raisin  Ubrogepant tablets What is this medicine? UBROGEPANT (ue BROE je pant) is used to treat migraine headaches with or without aura. An aura is a strange feeling or visual disturbance that warns you of an attack. It is not used to prevent migraines. This medicine may be used for other purposes; ask your health care provider or pharmacist if you have questions. COMMON BRAND NAME(S): Bernita Raisin What should I tell my health care provider before I take this medicine? They need to know if you have any of these conditions:  kidney disease  liver disease  an unusual or allergic reaction to ubrogepant, other medicines, foods, dyes, or preservatives  pregnant or trying to get pregnant  breast-feeding How should I use this medicine? Take this medicine by mouth with a glass of water. Follow the directions on the prescription label. You can take it with or without food. If it upsets your stomach, take it with food. Take your medicine at regular intervals. Do not take it more often than directed. Do not stop taking except on your doctor's advice. Talk to your pediatrician about the use of this medicine in children. Special care may be needed. Overdosage: If you think you have taken too much of this medicine contact a poison control center or emergency room at once. NOTE: This medicine is only for you. Do not share this medicine with others. What if I miss a dose? This does not apply. This medicine is not for regular use. What may interact with this medicine? Do not take this medicine with any of the following medicines:  ceritinib  certain antibiotics like chloramphenicol, clarithromycin, telithromycin  certain antivirals for HIV like atazanavir, cobicistat, darunavir, delavirdine, fosamprenavir, indinavir, ritonavir  certain medicines for fungal infections like itraconazole, ketoconazole, posaconazole,  voriconazole  conivaptan  grapefruit  idelalisib  mifepristone  nefazodone  ribociclib This medicine may also interact with the following medications:  carvedilol  certain medicines for seizures like phenobarbital, phenytoin  ciprofloxacin  cyclosporine  eltrombopag  fluconazole  fluvoxamine  quinidine  rifampin  St. John's wort  verapamil This list may not describe all possible interactions. Give your health care provider a list of all the medicines, herbs, non-prescription drugs, or dietary supplements you use. Also tell them if you smoke, drink alcohol, or use illegal drugs. Some items may interact with your medicine. What should I watch for while using this medicine? Visit your health care professional for regular checks on your progress. Tell your health care professional if your symptoms do not start to get better or if they get worse. Your mouth may get dry. Chewing sugarless gum or sucking hard candy and drinking plenty of water may help. Contact your health care professional if the problem does not go away or is severe. What side effects may I notice from receiving this medicine? Side effects that you should report to your doctor or health care professional as soon as possible:  allergic reactions like skin rash, itching or hives; swelling of the face, lips, or tongue Side effects that usually do not require medical attention (report these to your doctor or health care professional if they continue or are bothersome):  drowsiness  dry mouth  nausea  tiredness This list may not describe all possible side effects. Call your doctor for medical advice about side effects. You may report side effects to FDA at 1-800-FDA-1088. Where should I keep my medicine? Keep out of the reach of children. Store at  room temperature between 15 and 30 degrees C (59 and 86 degrees F). Throw away any unused medicine after the expiration date. NOTE: This sheet is a summary. It  may not cover all possible information. If you have questions about this medicine, talk to your doctor, pharmacist, or health care provider.  2020 Elsevier/Gold Standard (2019-01-14 08:50:55) OnabotulinumtoxinA injection (Medical Use) What is this medicine? ONABOTULINUMTOXINA (o na BOTT you lye num tox in eh) is a neuro-muscular blocker. This medicine is used to treat crossed eyes, eyelid spasms, severe neck muscle spasms, ankle and toe muscle spasms, and elbow, wrist, and finger muscle spasms. It is also used to treat excessive underarm sweating, to prevent chronic migraine headaches, and to treat loss of bladder control due to neurologic conditions such as multiple sclerosis or spinal cord injury. This medicine may be used for other purposes; ask your health care provider or pharmacist if you have questions. COMMON BRAND NAME(S): Botox What should I tell my health care provider before I take this medicine? They need to know if you have any of these conditions:  breathing problems  cerebral palsy spasms  difficulty urinating  heart problems  history of surgery where this medicine is going to be used  infection at the site where this medicine is going to be used  myasthenia gravis or other neurologic disease  nerve or muscle disease  surgery plans  take medicines that treat or prevent blood clots  thyroid problems  an unusual or allergic reaction to botulinum toxin, albumin, other medicines, foods, dyes, or preservatives  pregnant or trying to get pregnant  breast-feeding How should I use this medicine? This medicine is for injection into a muscle. It is given by a health care professional in a hospital or clinic setting. Talk to your pediatrician regarding the use of this medicine in children. While this drug may be prescribed for children as young as 1 years old for selected conditions, precautions do apply. Overdosage: If you think you have taken too much of this medicine  contact a poison control center or emergency room at once. NOTE: This medicine is only for you. Do not share this medicine with others. What if I miss a dose? This does not apply. What may interact with this medicine?  aminoglycoside antibiotics like gentamicin, neomycin, tobramycin  muscle relaxants  other botulinum toxin injections This list may not describe all possible interactions. Give your health care provider a list of all the medicines, herbs, non-prescription drugs, or dietary supplements you use. Also tell them if you smoke, drink alcohol, or use illegal drugs. Some items may interact with your medicine. What should I watch for while using this medicine? Visit your doctor for regular check ups. This medicine will cause weakness in the muscle where it is injected. Tell your doctor if you feel unusually weak in other muscles. Get medical help right away if you have problems with breathing, swallowing, or talking. This medicine might make your eyelids droop or make you see blurry or double. If you have weak muscles or trouble seeing do not drive a car, use machinery, or do other dangerous activities. This medicine contains albumin from human blood. It may be possible to pass an infection in this medicine, but no cases have been reported. Talk to your doctor about the risks and benefits of this medicine. If your activities have been limited by your condition, go back to your regular routine slowly after treatment with this medicine. What side effects may I notice  from receiving this medicine? Side effects that you should report to your doctor or health care professional as soon as possible:  allergic reactions like skin rash, itching or hives, swelling of the face, lips, or tongue  breathing problems  changes in vision  chest pain or tightness  eye irritation, pain  fast, irregular heartbeat  infection  numbness  speech problems  swallowing problems  unusual  weakness Side effects that usually do not require medical attention (report to your doctor or health care professional if they continue or are bothersome):  bruising or pain at site where injected  drooping eyelid  dry eyes or mouth  headache  muscles aches, pains  sensitivity to light  tearing This list may not describe all possible side effects. Call your doctor for medical advice about side effects. You may report side effects to FDA at 1-800-FDA-1088. Where should I keep my medicine? This drug is given in a hospital or clinic and will not be stored at home. NOTE: This sheet is a summary. It may not cover all possible information. If you have questions about this medicine, talk to your doctor, pharmacist, or health care provider.  2020 Elsevier/Gold Standard (2018-05-04 14:21:42) Vernell BarrierFremanezumab injection What is this medicine? FREMANEZUMAB (fre ma NEZ ue mab) is used to prevent migraine headaches. This medicine may be used for other purposes; ask your health care provider or pharmacist if you have questions. COMMON BRAND NAME(S): AJOVY What should I tell my health care provider before I take this medicine? They need to know if you have any of these conditions:  an unusual or allergic reaction to fremanezumab, other medicines, foods, dyes, or preservatives  pregnant or trying to get pregnant  breast-feeding How should I use this medicine? This medicine is for injection under the skin. You will be taught how to prepare and give this medicine. Use exactly as directed. Take your medicine at regular intervals. Do not take your medicine more often than directed. It is important that you put your used needles and syringes in a special sharps container. Do not put them in a trash can. If you do not have a sharps container, call your pharmacist or healthcare provider to get one. Talk to your pediatrician regarding the use of this medicine in children. Special care may be  needed. Overdosage: If you think you have taken too much of this medicine contact a poison control center or emergency room at once. NOTE: This medicine is only for you. Do not share this medicine with others. What if I miss a dose? If you miss a dose, take it as soon as you can. If it is almost time for your next dose, take only that dose. Do not take double or extra doses. What may interact with this medicine? Interactions are not expected. This list may not describe all possible interactions. Give your health care provider a list of all the medicines, herbs, non-prescription drugs, or dietary supplements you use. Also tell them if you smoke, drink alcohol, or use illegal drugs. Some items may interact with your medicine. What should I watch for while using this medicine? Tell your doctor or healthcare professional if your symptoms do not start to get better or if they get worse. What side effects may I notice from receiving this medicine? Side effects that you should report to your doctor or health care professional as soon as possible:  allergic reactions like skin rash, itching or hives, swelling of the face, lips, or tongue Side  effects that usually do not require medical attention (report these to your doctor or health care professional if they continue or are bothersome):  pain, redness, or irritation at site where injected This list may not describe all possible side effects. Call your doctor for medical advice about side effects. You may report side effects to FDA at 1-800-FDA-1088. Where should I keep my medicine? Keep out of the reach of children. You will be instructed on how to store this medicine. Throw away any unused medicine after the expiration date on the label. NOTE: This sheet is a summary. It may not cover all possible information. If you have questions about this medicine, talk to your doctor, pharmacist, or health care provider.  2020 Elsevier/Gold Standard (2017-07-28  17:22:56)

## 2020-07-26 ENCOUNTER — Telehealth: Payer: Self-pay | Admitting: Neurology

## 2020-07-26 NOTE — Telephone Encounter (Signed)
LVM for pt to call back about scheduling mri  [employer] Auth: [employer] via [facility] webstie

## 2020-08-03 ENCOUNTER — Telehealth: Payer: Self-pay | Admitting: *Deleted

## 2020-08-03 NOTE — Telephone Encounter (Signed)
Completed Bernita Raisin PA on Cover My Meds. Key: [id number]. Awaiting determination from [facility] anticipated within 72 hours.

## 2020-08-08 ENCOUNTER — Telehealth: Payer: Self-pay | Admitting: *Deleted

## 2020-08-08 NOTE — Telephone Encounter (Signed)
Dr Lucia Gaskins, Wanted to confirm this patient is supposed to start botox? If so I can complete the order sheet for [facility].

## 2020-08-09 NOTE — Telephone Encounter (Signed)
Yes please, she can see NP

## 2020-08-09 NOTE — Telephone Encounter (Signed)
Botox charge sheet completed.

## 2020-08-15 NOTE — Telephone Encounter (Signed)
I called [employer] [phone]) to see if PA is required for J0585 and [id number]. I spoke with Nyla who states PA is not required. She states that SP is [facility]. Reference [phone].   I called [facility] [phone]) to see if a PA is needed for Botox. I spoke with Alesia Banda who states no PA is needed because Botox is not covered under the pharmacy benefit. He states it will be processed under the medical benefit and I should be able to call customer service and schedule delivery.

## 2020-08-17 NOTE — Telephone Encounter (Signed)
Bernita Raisin has been denied by [facility] due to requirement that pt try/fail two triptans. Pt has tried Imitrex. Savings card should still allow pt to get this each month. If we should choose to appeal the denial, fax within 180 days to [phone]. Reference # [id number].   I called the pt and LVM (ok per DPR) advising Ubrelvy denied but savings card can still be used. I left information on message about how to get a savings card if pt doesn't already have one. I asked for a call back if pt has any questions or trouble getting the medication. Mychart message also sent to pt.

## 2020-08-22 NOTE — Telephone Encounter (Signed)
I called [facility] and spoke with Roe Coombs to set up patient's profile. He then transferred me to the pharmacy and I spoke with Dannielle Huh to give a verbal prescription. Dx code G43.711. (1) 200U vial to be dispensed with 3 additional refills. Inject 155 units IM into the head and neck muscles every 3 months.

## 2020-08-22 NOTE — Telephone Encounter (Signed)
I called patient and LVM asking her to call me back to discuss Botox and specialty pharmacy protocol/savings program.

## 2020-08-29 NOTE — Telephone Encounter (Addendum)
I attempted to contact the patient again today. I LVM asking her to call me back. FYI

## 2020-08-30 NOTE — Telephone Encounter (Signed)
Patient returned my call. I was able to make an appointment for her on 11/23 with Dr. Lucia Gaskins. I explained the Botox Savings Program and specialty pharmacy protocol. I gave her the number to [facility] and advised her to call them to set up Botox delivery. I advised her to call me back with any questions or concerns. I have mailed her a packet with information on the Botox Savings Program & specialty pharmacy. She verbalized understanding.

## 2020-09-27 NOTE — Telephone Encounter (Signed)
I spoke with the patient and went over the cost of the MRI she states she wants to wait on the MRI for now and do the Botox on Tuesday.. She also has a question that Dr. Lucia Gaskins gave her [name] and she hasn't taking it since 08/24/20 but she was not sure if she can take the Ajovy and Botox?

## 2020-09-27 NOTE — Telephone Encounter (Signed)
I called [facility] to schedule Botox delivery for 11/23 appointment. I spoke with Fleet Contras, who scheduled Botox delivery for the morning of 11/23.

## 2020-09-28 ENCOUNTER — Telehealth: Payer: Self-pay | Admitting: Neurology

## 2020-09-28 NOTE — Telephone Encounter (Signed)
Pt.states she has taken the 2 medications prescribed for migraines & neither has helped. She states she has had migraine for 2 days & pain is throbbing & pulsating over right eye. Please advise.  Pharmacy:  [facility] [phone]

## 2020-09-29 ENCOUNTER — Ambulatory Visit: Payer: 59

## 2020-10-03 ENCOUNTER — Ambulatory Visit: Payer: 59 | Admitting: Neurology

## 2020-10-03 NOTE — Telephone Encounter (Signed)
Botox arrived today from [place]. Patient cancelled her Botox appointment. I called patient back and LVM to reschedule. FYI

## 2020-10-04 NOTE — Telephone Encounter (Signed)
I called the pt and LVM (ok per DPR) letting pt know if she is still suffering from migraine to give our office a call and she can speak with on-call doctor if office closed. Advised pt if she is having any severe symptoms, any new/concerning symptoms like numbness and tingling, etc to proceed to ER. Otherwise we hope she is feeling better now. Left office number in message.

## 2020-10-11 NOTE — Telephone Encounter (Signed)
Patient called back wanting to reschedule her Botox appointment that she cancelled on 11/23. I rescheduled her for Dr. Trevor Mace next available on 12/21. Advised patient I would call her if there are any cancellations.

## 2020-10-31 ENCOUNTER — Ambulatory Visit: Payer: 59 | Admitting: Neurology

## 2020-10-31 DIAGNOSIS — G43711 Chronic migraine without aura, intractable, with status migrainosus: Secondary | ICD-10-CM

## 2020-10-31 NOTE — Progress Notes (Signed)
Consent Form Botulism Toxin Injection For Chronic Migraine  Our first botox. Discussed Ajovy* in the future if this doesn't work. She takes [place] and Fioricet acutely.  Reviewed orally with patient, additionally signature is on file:  Botulism toxin has been approved by the Federal drug administration for treatment of chronic migraine. Botulism toxin does not cure chronic migraine and it may not be effective in some patients.  The administration of botulism toxin is accomplished by injecting a small amount of toxin into the muscles of the neck and head. Dosage must be titrated for each individual. Any benefits resulting from botulism toxin tend to wear off after 3 months with a repeat injection required if benefit is to be maintained. Injections are usually done every 3-4 months with maximum effect peak achieved by about 2 or 3 weeks. Botulism toxin is expensive and you should be sure of what costs you will incur resulting from the injection.  The side effects of botulism toxin use for chronic migraine may include:   -Transient, and usually mild, facial weakness with facial injections  -Transient, and usually mild, head or neck weakness with head/neck injections  -Reduction or loss of forehead facial animation due to forehead muscle weakness  -Eyelid drooping  -Dry eye  -Pain at the site of injection or bruising at the site of injection  -Double vision  -Potential unknown long term risks  Contraindications: You should not have Botox if you are pregnant, nursing, allergic to albumin, have an infection, skin condition, or muscle weakness at the site of the injection, or have myasthenia gravis, Lambert-Eaton syndrome, or ALS.  It is also possible that as with any injection, there may be an allergic reaction or no effect from the medication. Reduced effectiveness after repeated injections is sometimes seen and rarely infection at the injection site may occur. All care will be taken to prevent  these side effects. If therapy is given over a long time, atrophy and wasting in the muscle injected may occur. Occasionally the patient's become refractory to treatment because they develop antibodies to the toxin. In this event, therapy needs to be modified.  I have read the above information and consent to the administration of botulism toxin.    BOTOX PROCEDURE NOTE FOR MIGRAINE HEADACHE    Contraindications and precautions discussed with patient(above). Aseptic procedure was observed and patient tolerated procedure. Procedure performed by Dr. Artemio Aly  The condition has existed for more than 6 months, and pt does not have a diagnosis of ALS, Myasthenia Gravis or Lambert-Eaton Syndrome.  Risks and benefits of injections discussed and pt agrees to proceed with the procedure.  Written consent obtained  These injections are medically necessary. Pt  receives good benefits from these injections. These injections do not cause sedations or hallucinations which the oral therapies may cause.  Description of procedure:  The patient was placed in a sitting position. The standard protocol was used for Botox as follows, with 5 units of Botox injected at each site:   -Procerus muscle, midline injection  -Corrugator muscle, bilateral injection  -Frontalis muscle, bilateral injection, with 2 sites each side, medial injection was performed in the upper one third of the frontalis muscle, in the region vertical from the medial inferior edge of the superior orbital rim. The lateral injection was again in the upper one third of the forehead vertically above the lateral limbus of the cornea, 1.5 cm lateral to the medial injection site.  -Temporalis muscle injection, 4 sites, bilaterally. The first injection  was 3 cm above the tragus of the ear, second injection site was 1.5 cm to 3 cm up from the first injection site in line with the tragus of the ear. The third injection site was 1.5-3 cm forward between  the first 2 injection sites. The fourth injection site was 1.5 cm posterior to the second injection site.   -Occipitalis muscle injection, 3 sites, bilaterally. The first injection was done one half way between the occipital protuberance and the tip of the mastoid process behind the ear. The second injection site was done lateral and superior to the first, 1 fingerbreadth from the first injection. The third injection site was 1 fingerbreadth superiorly and medially from the first injection site.  -Cervical paraspinal muscle injection, 2 sites, bilateral knee first injection site was 1 cm from the midline of the cervical spine, 3 cm inferior to the lower border of the occipital protuberance. The second injection site was 1.5 cm superiorly and laterally to the first injection site.  -Trapezius muscle injection was performed at 3 sites, bilaterally. The first injection site was in the upper trapezius muscle halfway between the inflection point of the neck, and the acromion. The second injection site was one half way between the acromion and the first injection site. The third injection was done between the first injection site and the inflection point of the neck.   Will return for repeat injection in 3 months.   200 units of Botox was used, any Botox not injected was wasted. The patient tolerated the procedure well, there were no complications of the above procedure.

## 2020-10-31 NOTE — Progress Notes (Signed)
Botox- 200 units x 1 vial Lot: [id number] Expiration: 04/2023 NDC: 9470-7615-18  Bacteriostatic 0.9% Sodium Chloride- 26mL total Lot: [id number] Expiration: 12/12/2021 NDC: 7897-8478-41  Dx: Q82.081 S/P

## 2020-11-14 ENCOUNTER — Telehealth: Payer: 59 | Admitting: Emergency Medicine

## 2020-11-14 DIAGNOSIS — N898 Other specified noninflammatory disorders of vagina: Secondary | ICD-10-CM | POA: Diagnosis not present

## 2020-11-14 MED ORDER — METRONIDAZOLE 500 MG PO TABS: 500.0000 mg | ORAL_TABLET | Freq: Two times a day (BID) | ORAL | 0 refills | Status: DC

## 2020-11-14 NOTE — Progress Notes (Signed)

## 2020-12-17 ENCOUNTER — Telehealth: Payer: 59 | Admitting: Nurse Practitioner

## 2020-12-17 DIAGNOSIS — B9689 Other specified bacterial agents as the cause of diseases classified elsewhere: Secondary | ICD-10-CM

## 2020-12-17 DIAGNOSIS — B379 Candidiasis, unspecified: Secondary | ICD-10-CM

## 2020-12-17 DIAGNOSIS — N760 Acute vaginitis: Secondary | ICD-10-CM

## 2020-12-17 MED ORDER — FLUCONAZOLE 150 MG PO TABS: 150.0000 mg | ORAL_TABLET | Freq: Once | ORAL | 0 refills | Status: AC

## 2020-12-17 MED ORDER — METRONIDAZOLE 0.75 % VA GEL: 1.0000 | Freq: Every day | VAGINAL | 0 refills | Status: AC

## 2020-12-17 NOTE — Progress Notes (Signed)
We are sorry that you are not feeling well. Here is how we plan to help! Based on what you shared with me it looks like you: May have a vaginosis due to bacteria and yeast.  Vaginosis is an inflammation of the vagina that can result in discharge, itching and pain. The cause is usually a change in the normal balance of vaginal bacteria or an infection. Vaginosis can also result from reduced estrogen levels after menopause.  The most common causes of vaginosis are:   Bacterial vaginosis which results from an overgrowth of one on several organisms that are normally present in your vagina.   Yeast infections which are caused by a naturally occurring fungus called candida.   Vaginal atrophy (atrophic vaginosis) which results from the thinning of the vagina from reduced estrogen levels after menopause.   Trichomoniasis which is caused by a parasite and is commonly transmitted by sexual intercourse.  Factors that increase your risk of developing vaginosis include: Marland Kitchen Medications, such as antibiotics and steroids . Uncontrolled diabetes . Use of hygiene products such as bubble bath, vaginal spray or vaginal deodorant . Douching . Wearing damp or tight-fitting clothing . Using an intrauterine device (IUD) for birth control . Hormonal changes, such as those associated with pregnancy, birth control pills or menopause . Sexual activity . Having a sexually transmitted infection  Your treatment plan is Metronidazole gel 0.75%, insert one applicator vaginally for 5 nights. I am prescribing the vaginal gel as you have recently completed an oral antibiotic within the past 30 days.  I am also prescribing Fluconazole 150mg  tablet, take one tablet for yeast infection symptoms, one time dose.   Be sure to take all of the medication as directed. Stop taking any medication if you develop a rash, tongue swelling or shortness of breath. Mothers who are breast feeding should consider pumping and discarding their  breast milk while on these antibiotics. However, there is no consensus that infant exposure at these doses would be harmful.  Remember that medication creams can weaken latex condoms.   HOME CARE:  Good hygiene may prevent some types of vaginosis from recurring and may relieve some symptoms:  . Avoid baths, hot tubs and whirlpool spas. Rinse soap from your outer genital area after a shower, and dry the area well to prevent irritation. Don't use scented or harsh soaps, such as those with deodorant or antibacterial action. Marland Kitchen Avoid irritants. These include scented tampons and pads. . Wipe from front to back after using the toilet. Doing so avoids spreading fecal bacteria to your vagina.  Other things that may help prevent vaginosis include:  Marland Kitchen Don't douche. Your vagina doesn't require cleansing other than normal bathing. Repetitive douching disrupts the normal organisms that reside in the vagina and can actually increase your risk of vaginal infection. Douching won't clear up a vaginal infection. . Use a latex condom. Both female and female latex condoms may help you avoid infections spread by sexual contact. . Wear cotton underwear. Also wear pantyhose with a cotton crotch. If you feel comfortable without it, skip wearing underwear to bed. Yeast thrives in Marland Kitchen Your symptoms should improve in the next day or two.  GET HELP RIGHT AWAY IF:  . You have pain in your lower abdomen ( pelvic area or over your ovaries) . You develop nausea or vomiting . You develop a fever . Your discharge changes or worsens . You have persistent pain with intercourse . You develop shortness of breath, a  rapid pulse, or you faint.  These symptoms could be signs of problems or infections that need to be evaluated by a medical provider now.  MAKE SURE YOU    Understand these instructions.  Will watch your condition.  Will get help right away if you are not doing well or get worse.  Your  e-visit answers were reviewed by a board certified advanced clinical practitioner to complete your personal care plan. Depending upon the condition, your plan could have included both over the counter or prescription medications. Please review your pharmacy choice to make sure that you have choses a pharmacy that is open for you to pick up any needed prescription, Your safety is important to [place]. If you have drug allergies check your prescription carefully.   You can use MyChart to ask questions about today's visit, request a non-urgent call back, or ask for a work or school excuse for 24 hours related to this e-Visit. If it has been greater than 24 hours you will need to follow up with your provider, or enter a new e-Visit to address those concerns. You will get a MyChart message within the next two days asking about your experience. I hope that your e-visit has been valuable and will speed your recovery.  I have spent at least 5 minutes reviewing and documenting in the patient's chart.

## 2020-12-22 DIAGNOSIS — N898 Other specified noninflammatory disorders of vagina: Secondary | ICD-10-CM | POA: Diagnosis not present

## 2021-01-03 ENCOUNTER — Telehealth: Payer: Self-pay | Admitting: Neurology

## 2021-01-03 NOTE — Telephone Encounter (Signed)
I received a fax from [facility] stating they have been trying to reach patient regarding scheduling delivery of Botox for her 3/22 appointment. Patient's insurance appears to have termed, so I called the patient to discuss. Patient states that she does have new insurance through [facility]. Patient provided me with that information and I put it in her chart.

## 2021-01-03 NOTE — Telephone Encounter (Signed)
I called [employer] and spoke with Devin to check PA requirements for [phone] and 84665. Devin states no PA is required for either code for Dx G43.711 and the patient will be B/B. Reference [phone]. I called the patient back and LVM to advise that we will no longer be using specialty pharmacy under this insurance.

## 2021-01-04 DIAGNOSIS — Z713 Dietary counseling and surveillance: Secondary | ICD-10-CM | POA: Diagnosis not present

## 2021-01-04 DIAGNOSIS — Z6836 Body mass index (BMI) 36.0-36.9, adult: Secondary | ICD-10-CM | POA: Diagnosis not present

## 2021-01-06 ENCOUNTER — Telehealth: Payer: Self-pay | Admitting: Nurse Practitioner

## 2021-01-06 DIAGNOSIS — B9689 Other specified bacterial agents as the cause of diseases classified elsewhere: Secondary | ICD-10-CM

## 2021-01-06 NOTE — Progress Notes (Signed)
Based on what you shared with me it looks like you have bacterial vaginosis,that should be evaluated in a face to face office visit. Since you were just treated on 12/17/20 you will have to have a face to face visit.    NOTE: If you entered your credit card information for this eVisit, you will not be charged. You may see a "hold" on your card for the $35 but that hold will drop off and you will not have a charge processed.  If you are having a true medical emergency please call 911.     For an urgent face to face visit, [place] has four urgent care centers for your convenience:   . [facility]    [phone]                  Get Driving Directions  [address] [place], [place] [address] . 10 am to 8 pm Monday-Friday . 12 pm to 8 pm Saturday-Sunday   . [facility] at [facility]  [phone]                  Get Driving Directions  [address] [place], [place] [address] . 8 am to 8 pm Monday-Friday . 9 am to 6 pm Saturday . 11 am to 6 pm Sunday   . [facility] at [facility]  [phone]                  Get Driving Directions   [address] [place], [place] [address] . 8 am to 8 pm Monday-Friday . 8 am to 4 pm Saturday-Sunday    . [facility] at [facility] Directions  [phone]  [address] [place], [place] [address]  . Monday-Friday, 12 PM to 6 PM    Your e-visit answers were reviewed by a board certified advanced clinical practitioner to complete your personal care plan.  Thank you for using e-Visits.

## 2021-01-24 DIAGNOSIS — Z202 Contact with and (suspected) exposure to infections with a predominantly sexual mode of transmission: Secondary | ICD-10-CM | POA: Diagnosis not present

## 2021-01-24 DIAGNOSIS — Z124 Encounter for screening for malignant neoplasm of cervix: Secondary | ICD-10-CM | POA: Diagnosis not present

## 2021-01-24 DIAGNOSIS — Z01419 Encounter for gynecological examination (general) (routine) without abnormal findings: Secondary | ICD-10-CM | POA: Diagnosis not present

## 2021-01-24 DIAGNOSIS — I10 Essential (primary) hypertension: Secondary | ICD-10-CM | POA: Diagnosis not present

## 2021-01-24 DIAGNOSIS — Z118 Encounter for screening for other infectious and parasitic diseases: Secondary | ICD-10-CM | POA: Diagnosis not present

## 2021-01-24 DIAGNOSIS — Z01411 Encounter for gynecological examination (general) (routine) with abnormal findings: Secondary | ICD-10-CM | POA: Diagnosis not present

## 2021-01-24 DIAGNOSIS — Z1151 Encounter for screening for human papillomavirus (HPV): Secondary | ICD-10-CM | POA: Diagnosis not present

## 2021-01-24 DIAGNOSIS — Z113 Encounter for screening for infections with a predominantly sexual mode of transmission: Secondary | ICD-10-CM | POA: Diagnosis not present

## 2021-01-24 DIAGNOSIS — Z803 Family history of malignant neoplasm of breast: Secondary | ICD-10-CM | POA: Diagnosis not present

## 2021-01-24 DIAGNOSIS — G43909 Migraine, unspecified, not intractable, without status migrainosus: Secondary | ICD-10-CM | POA: Insufficient documentation

## 2021-01-24 DIAGNOSIS — E669 Obesity, unspecified: Secondary | ICD-10-CM | POA: Diagnosis not present

## 2021-01-30 ENCOUNTER — Ambulatory Visit (INDEPENDENT_AMBULATORY_CARE_PROVIDER_SITE_OTHER): Payer: 59 | Admitting: Neurology

## 2021-01-30 DIAGNOSIS — G43711 Chronic migraine without aura, intractable, with status migrainosus: Secondary | ICD-10-CM | POA: Diagnosis not present

## 2021-01-30 NOTE — Progress Notes (Signed)
Botox- 200 units x 1 vial Lot: [id number] Expiration: 10/2023 NDC: 0023-3921-02  Bacteriostatic 0.9% Sodium Chloride- 4mL total Lot: [id number] Expiration: 12/12/2021 NDC: 0409-1966-02  Dx: G43.711 B/B  

## 2021-01-30 NOTE — Progress Notes (Signed)
Consent Form Botulism Toxin Injection For Chronic Migraine  01/30/2021: Our second botox. Has been life changing for her, significant improvement >>90%.  Discussed Ajovy* in the future , she does not need it, botox alone is amazing. She takes [place] and Fioricet acutely.no masseters. +5U each orb oculi. Do the injections high up in the forehead she felt her eyebrows were slightly peaked.   Reviewed orally with patient, additionally signature is on file:  Botulism toxin has been approved by the Federal drug administration for treatment of chronic migraine. Botulism toxin does not cure chronic migraine and it may not be effective in some patients.  The administration of botulism toxin is accomplished by injecting a small amount of toxin into the muscles of the neck and head. Dosage must be titrated for each individual. Any benefits resulting from botulism toxin tend to wear off after 3 months with a repeat injection required if benefit is to be maintained. Injections are usually done every 3-4 months with maximum effect peak achieved by about 2 or 3 weeks. Botulism toxin is expensive and you should be sure of what costs you will incur resulting from the injection.  The side effects of botulism toxin use for chronic migraine may include:   -Transient, and usually mild, facial weakness with facial injections  -Transient, and usually mild, head or neck weakness with head/neck injections  -Reduction or loss of forehead facial animation due to forehead muscle weakness  -Eyelid drooping  -Dry eye  -Pain at the site of injection or bruising at the site of injection  -Double vision  -Potential unknown long term risks  Contraindications: You should not have Botox if you are pregnant, nursing, allergic to albumin, have an infection, skin condition, or muscle weakness at the site of the injection, or have myasthenia gravis, Lambert-Eaton syndrome, or ALS.  It is also possible that as with any injection,  there may be an allergic reaction or no effect from the medication. Reduced effectiveness after repeated injections is sometimes seen and rarely infection at the injection site may occur. All care will be taken to prevent these side effects. If therapy is given over a long time, atrophy and wasting in the muscle injected may occur. Occasionally the patient's become refractory to treatment because they develop antibodies to the toxin. In this event, therapy needs to be modified.  I have read the above information and consent to the administration of botulism toxin.    BOTOX PROCEDURE NOTE FOR MIGRAINE HEADACHE    Contraindications and precautions discussed with patient(above). Aseptic procedure was observed and patient tolerated procedure. Procedure performed by Dr. Artemio Aly  The condition has existed for more than 6 months, and pt does not have a diagnosis of ALS, Myasthenia Gravis or Lambert-Eaton Syndrome.  Risks and benefits of injections discussed and pt agrees to proceed with the procedure.  Written consent obtained  These injections are medically necessary. Pt  receives good benefits from these injections. These injections do not cause sedations or hallucinations which the oral therapies may cause.  Description of procedure:  The patient was placed in a sitting position. The standard protocol was used for Botox as follows, with 5 units of Botox injected at each site:   -Procerus muscle, midline injection  -Corrugator muscle, bilateral injection  -Frontalis muscle, bilateral injection, with 2 sites each side, medial injection was performed in the upper one third of the frontalis muscle, in the region vertical from the medial inferior edge of the superior orbital rim. The  lateral injection was again in the upper one third of the forehead vertically above the lateral limbus of the cornea, 1.5 cm lateral to the medial injection site.  -Temporalis muscle injection, 4 sites, bilaterally.  The first injection was 3 cm above the tragus of the ear, second injection site was 1.5 cm to 3 cm up from the first injection site in line with the tragus of the ear. The third injection site was 1.5-3 cm forward between the first 2 injection sites. The fourth injection site was 1.5 cm posterior to the second injection site.   -Occipitalis muscle injection, 3 sites, bilaterally. The first injection was done one half way between the occipital protuberance and the tip of the mastoid process behind the ear. The second injection site was done lateral and superior to the first, 1 fingerbreadth from the first injection. The third injection site was 1 fingerbreadth superiorly and medially from the first injection site.  -Cervical paraspinal muscle injection, 2 sites, bilateral knee first injection site was 1 cm from the midline of the cervical spine, 3 cm inferior to the lower border of the occipital protuberance. The second injection site was 1.5 cm superiorly and laterally to the first injection site.  -Trapezius muscle injection was performed at 3 sites, bilaterally. The first injection site was in the upper trapezius muscle halfway between the inflection point of the neck, and the acromion. The second injection site was one half way between the acromion and the first injection site. The third injection was done between the first injection site and the inflection point of the neck.   Will return for repeat injection in 3 months.   200 units of Botox was used, any Botox not injected was wasted. The patient tolerated the procedure well, there were no complications of the above procedure.

## 2021-02-05 ENCOUNTER — Telehealth: Payer: Self-pay | Admitting: Physician Assistant

## 2021-02-05 ENCOUNTER — Encounter: Payer: Self-pay | Admitting: Physician Assistant

## 2021-02-05 DIAGNOSIS — E559 Vitamin D deficiency, unspecified: Secondary | ICD-10-CM | POA: Insufficient documentation

## 2021-02-05 DIAGNOSIS — B9689 Other specified bacterial agents as the cause of diseases classified elsewhere: Secondary | ICD-10-CM

## 2021-02-05 DIAGNOSIS — I10 Essential (primary) hypertension: Secondary | ICD-10-CM | POA: Insufficient documentation

## 2021-02-05 DIAGNOSIS — J019 Acute sinusitis, unspecified: Secondary | ICD-10-CM

## 2021-02-05 MED ORDER — FLUTICASONE PROPIONATE 50 MCG/ACT NA SUSP: 2.0000 | Freq: Every day | NASAL | 0 refills | Status: DC

## 2021-02-05 MED ORDER — DOXYCYCLINE HYCLATE 100 MG PO CAPS: 100.0000 mg | ORAL_CAPSULE | Freq: Two times a day (BID) | ORAL | 0 refills | Status: DC

## 2021-02-05 NOTE — Progress Notes (Signed)
Ms. nasiyah, laverdiere are scheduled for a virtual visit with your provider today.    Just as we do with appointments in the office, we must obtain your consent to participate.  Your consent will be active for this visit and any virtual visit you may have with one of our providers in the next 365 days.    If you have a MyChart account, I can also send a copy of this consent to you electronically.  All virtual visits are billed to your insurance company just like a traditional visit in the office.  As this is a virtual visit, video technology does not allow for your provider to perform a traditional examination.  This may limit your provider's ability to fully assess your condition.  If your provider identifies any concerns that need to be evaluated in person or the need to arrange testing such as labs, EKG, etc, we will make arrangements to do so.    Although advances in technology are sophisticated, we cannot ensure that it will always work on either your end or our end.  If the connection with a video visit is poor, we may have to switch to a telephone visit.  With either a video or telephone visit, we are not always able to ensure that we have a secure connection.   I need to obtain your verbal consent now.   Are you willing to proceed with your visit today?   OMER MONTER has provided verbal consent on 02/05/2021 for a virtual visit (video or telephone).   Piedad Climes, PA-C 02/05/2021  10:16 AM   Virtual Visit via Video   I connected with patient on 02/05/21 at 10:30 AM EDT by a video enabled telemedicine application and verified that I am speaking with the correct person using two identifiers.  Location patient: Home Location provider: [facility] Persons participating in the virtual visit: Patient, Provider  I discussed the limitations of evaluation and management by telemedicine and the availability of in person appointments. The patient expressed understanding and  agreed to proceed.  Subjective:   HPI:  Patient presents via [facility] today complaining of significant nasal and head congestion with sinus pressure and now sinus pain, initially started 5 days ago.  Patient endorses significant PND, worse at night with scratchy throat.  Notes symptoms are worsening despite use of her seasonal antihistamine (Zyrtec).  Denies fever, chills or aches.  Denies recent travel.  Notes her son has had a little cold recently.  No other sick contact.  Is up-to-date on her Covid vaccines and booster.  Denies any known Covid exposure.  In addition to her Zyrtec she has tried Sudafed but has not noted much improvement in her symptoms.  States she is prone to sinusitis.  Just finished a course of metronidazole for BV.   ROS:   See pertinent positives and negatives per HPI.  Patient Active Problem List   Diagnosis Date Noted  . Vitamin D deficiency 02/05/2021  . Essential (primary) hypertension 02/05/2021  . Migraine 01/24/2021  . Chronic migraine without aura, with intractable migraine, so stated, with status migrainosus 07/25/2020  . Obesity (BMI 30-39.9) 06/21/2019  . Tension headache 06/21/2019  . Stress reaction 06/21/2019  . Family history of colon cancer 05/05/2015  . Menorrhagia with regular cycle 05/05/2015  . Gastroesophageal reflux disease 05/05/2015  . Seasonal allergic rhinitis 02/17/2015  . Migraine without aura and without status migrainosus, not intractable 07/01/2014    Social History   Tobacco  Use  . Smoking status: Never Smoker  . Smokeless tobacco: Never Used  Substance Use Topics  . Alcohol use: Yes    Comment: occ    Current Outpatient Medications:  .  Butalbital-APAP-Caffeine (FIORICET) 50-300-40 MG CAPS, 1 capsule as needed, Disp: , Rfl:  .  Levonorgest-Eth Estrad 91-Day (SEASONIQUE PO), Seasonique 0.15 mg-30 mcg (84)/10 mcg(7) tablets,3 month dose pack  Take 1 tablet every day by oral route., Disp: , Rfl:  .  metroNIDAZOLE (FLAGYL)  500 MG tablet, Take 1 tablet (500 mg total) by mouth 2 (two) times daily., Disp: 14 tablet, Rfl: 0 .  Ubrogepant (UBRELVY) 100 MG TABS, Take 100 mg by mouth every 2 (two) hours as needed. Maximum 200mg  a day., Disp: 10 tablet, Rfl: 6  Allergies  Allergen Reactions  . Bactrim [Sulfamethoxazole-Trimethoprim]     Chills, joint pain, sweating, headache  . Percocet [Oxycodone-Acetaminophen] Itching  . Topiramate Rash    Objective:   There were no vitals taken for this visit.  Patient is well-developed, well-nourished in no acute distress.  Resting comfortably  at home.  Head is normocephalic, atraumatic.  No labored breathing.  Speech is clear and coherent with logical content.  Patient is alert and oriented at baseline.  + TTP frontal sinuses bilaterally - patient performed  Assessment and Plan:   1. Acute bacterial sinusitis Rx doxycycline.  Increase fluids.  Rest.  Saline nasal spray.  Probiotic.  Mucinex as directed.  Humidifier in bedroom. Start Flonase. Continue zyrtec.  Call or be seen in person if symptoms are not resolving.   - doxycycline (VIBRAMYCIN) 100 MG capsule; Take 1 capsule (100 mg total) by mouth 2 (two) times daily.  Dispense: 20 capsule; Refill: 0 - fluticasone (FLONASE) 50 MCG/ACT nasal spray; Place 2 sprays into both nostrils daily.  Dispense: 16 g; Refill: 0 .   , Piedad Climes 02/05/2021

## 2021-02-05 NOTE — Patient Instructions (Signed)
Instructions sent to patients MyChart.

## 2021-03-26 ENCOUNTER — Telehealth: Payer: Self-pay | Admitting: Physician Assistant

## 2021-03-26 DIAGNOSIS — B9689 Other specified bacterial agents as the cause of diseases classified elsewhere: Secondary | ICD-10-CM

## 2021-03-26 DIAGNOSIS — N760 Acute vaginitis: Secondary | ICD-10-CM

## 2021-03-26 MED ORDER — CLINDAMYCIN PHOSPHATE 2 % VA CREA: 1.0000 | TOPICAL_CREAM | Freq: Every day | VAGINAL | 0 refills | Status: DC

## 2021-03-26 NOTE — Progress Notes (Signed)

## 2021-03-26 NOTE — Progress Notes (Signed)
I have spent 5 minutes in review of e-visit questionnaire, review and updating patient chart, medical decision making and response to patient.   Ellanore Vanhook Cody Darbi Chandran, PA-C    

## 2021-05-05 ENCOUNTER — Other Ambulatory Visit: Payer: Self-pay

## 2021-05-05 ENCOUNTER — Emergency Department (HOSPITAL_BASED_OUTPATIENT_CLINIC_OR_DEPARTMENT_OTHER)
Admission: EM | Admit: 2021-05-05 | Discharge: 2021-05-05 | Disposition: A | Discharge status: Home / Self Care | Payer: BC Managed Care – PPO | Attending: Emergency Medicine | Admitting: Emergency Medicine

## 2021-05-05 ENCOUNTER — Encounter (HOSPITAL_BASED_OUTPATIENT_CLINIC_OR_DEPARTMENT_OTHER): Payer: Self-pay | Admitting: Emergency Medicine

## 2021-05-05 DIAGNOSIS — I10 Essential (primary) hypertension: Secondary | ICD-10-CM | POA: Insufficient documentation

## 2021-05-05 DIAGNOSIS — R519 Headache, unspecified: Secondary | ICD-10-CM | POA: Diagnosis not present

## 2021-05-05 DIAGNOSIS — R0789 Other chest pain: Secondary | ICD-10-CM | POA: Diagnosis not present

## 2021-05-05 DIAGNOSIS — R030 Elevated blood-pressure reading, without diagnosis of hypertension: Secondary | ICD-10-CM | POA: Diagnosis not present

## 2021-05-05 HISTORY — DX: Essential (primary) hypertension: I10

## 2021-05-05 LAB — COMPREHENSIVE METABOLIC PANEL
ALT: 17 U/L (ref 0–44)
AST: 22 U/L (ref 15–41)
Albumin: 3.7 g/dL (ref 3.5–5.0)
Alkaline Phosphatase: 37 U/L — ABNORMAL LOW (ref 38–126)
Anion gap: 6 (ref 5–15)
BUN: 12 mg/dL (ref 6–20)
CO2: 26 mmol/L (ref 22–32)
Calcium: 9.1 mg/dL (ref 8.9–10.3)
Chloride: 103 mmol/L (ref 98–111)
Creatinine, Ser: 0.92 mg/dL (ref 0.44–1.00)
GFR, Estimated: >60 mL/min (ref >60)
Glucose, Bld: 94 mg/dL (ref 70–99)
Potassium: 3.8 mmol/L (ref 3.5–5.1)
Sodium: 135 mmol/L (ref 135–145)
Total Bilirubin: 0.2 mg/dL — ABNORMAL LOW (ref 0.3–1.2)
Total Protein: 7 g/dL (ref 6.5–8.1)

## 2021-05-05 LAB — PREGNANCY, URINE: Preg Test, Ur: NEGATIVE

## 2021-05-05 LAB — CBC
HCT: 37.7 % (ref 36.0–46.0)
Hemoglobin: 12.3 g/dL (ref 12.0–15.0)
MCH: 29.1 pg (ref 26.0–34.0)
MCHC: 32.6 g/dL (ref 30.0–36.0)
MCV: 89.3 fL (ref 80.0–100.0)
Platelets: 210 10*3/uL (ref 150–400)
RBC: 4.22 MIL/uL (ref 3.87–5.11)
RDW: 13.5 % (ref 11.5–15.5)
WBC: 6.6 10*3/uL (ref 4.0–10.5)
nRBC: 0 % (ref 0.0–0.2)

## 2021-05-05 LAB — TROPONIN I (HIGH SENSITIVITY): Troponin I (High Sensitivity): 3 ng/L (ref <18)

## 2021-05-05 MED ORDER — DEXAMETHASONE SODIUM PHOSPHATE 10 MG/ML IJ SOLN
10.0000 mg | Freq: Once | INTRAMUSCULAR | Status: AC
  Administered 2021-05-05: 10 mg via INTRAVENOUS
  Filled 2021-05-05: qty 1

## 2021-05-05 MED ORDER — MAGNESIUM SULFATE 2 GM/50ML IV SOLN
2.0000 g | Freq: Once | INTRAVENOUS | Status: AC
  Administered 2021-05-05: 2 g via INTRAVENOUS
  Filled 2021-05-05: qty 50

## 2021-05-05 MED ORDER — SODIUM CHLORIDE 0.9 % IV SOLN
INTRAVENOUS | Status: DC | PRN
  Administered 2021-05-05: 250 mL via INTRAVENOUS

## 2021-05-05 NOTE — ED Provider Notes (Signed)
[facility] EMERGENCY DEPARTMENT Provider Note   CSN: [id number] Arrival date & time: 05/05/21  1853     History Chief Complaint  Patient presents with   Hypertension    [name] is a 35 y.o. female.  35yo F w/ PMH including HTN, migraines, anxiety, GERD who p/w hypertension and headaches.  Patient states that she was diagnosed with hypertension about a month ago by her OB/GYN and she was started on 12.5 mg hydrochlorothiazide daily.  She notes that the medication does not seem to be working and she is continue to have elevated blood pressures, up to 202/108 at home today.  Recently she has been having intermittent pulsating headaches, pressure behind her eyes, and generalized fatigue.  She notes these headaches feel different from her usual migraine.  No sudden onset of severe headache.  She started having central, nonradiating chest tightness earlier today before noon and pain has been constant since it began.  Pain is not exertional and not associated with any activities.  She denies any recent illness.  She has taken her usual migraine medications without relief.  She has not followed up with her PCP regarding her blood pressure.  She denies any alcohol or drug problems.  The history is provided by the patient.  Hypertension      Past Medical History:  Diagnosis Date   Anxiety    H/O seasonal allergies    Hypertension    Migraine     Patient Active Problem List   Diagnosis Date Noted   Vitamin D deficiency 02/05/2021   Essential (primary) hypertension 02/05/2021   Migraine 01/24/2021   Chronic migraine without aura, with intractable migraine, so stated, with status migrainosus 07/25/2020   Obesity (BMI 30-39.9) 06/21/2019   Tension headache 06/21/2019   Stress reaction 06/21/2019   Family history of colon cancer 05/05/2015   Menorrhagia with regular cycle 05/05/2015   Gastroesophageal reflux disease 05/05/2015   Seasonal allergic rhinitis 02/17/2015    Migraine without aura and without status migrainosus, not intractable 07/01/2014    Past Surgical History:  Procedure Laterality Date   CESAREAN SECTION N/A 04/30/2013   Procedure: CESAREAN SECTION;  Surgeon: Kirkland Hun, MD;  Location: [facility];  Service: Obstetrics;  Laterality: N/A;     OB History     Gravida  3   Para  1   Term  1   Preterm      AB  2   Living  1      SAB      IAB  1   Ectopic      Multiple      Live Births  1           Family History  Problem Relation Age of Onset   Healthy Mother    Migraines Mother    Prostate cancer Father    Healthy Son    Colon cancer Paternal Grandmother    Migraines Maternal Grandmother    Heart disease Neg Hx    Hypertension Neg Hx     Social History   Tobacco Use   Smoking status: Never   Smokeless tobacco: Never  Vaping Use   Vaping Use: Never used  Substance Use Topics   Alcohol use: Yes    Comment: occ   Drug use: No    Home Medications Prior to Admission medications   Medication Sig Start Date End Date Taking? Authorizing Provider  Butalbital-APAP-Caffeine (FIORICET) 50-300-40 MG CAPS 1 capsule as needed 07/05/20  [provider]  clindamycin (CLEOCIN) 2 % vaginal cream Place 1 Applicatorful vaginally at bedtime. Insert 1 Applicatorful of medication into the vagina once nightly x 7 nights 03/26/21   Waldon Merl, PA-C  fluticasone [facility]) 50 MCG/ACT nasal spray Place 2 sprays into both nostrils daily. 02/05/21   Waldon Merl, PA-C  Levonorgest-Eth Estrad 91-Day (SEASONIQUE PO) Seasonique 0.15 mg-30 mcg (84)/10 mcg(7) tablets,3 month dose pack  Take 1 tablet every day by oral route.    [provider]  Ubrogepant (UBRELVY) 100 MG TABS Take 100 mg by mouth every 2 (two) hours as needed. Maximum 200mg  a day. 07/25/20   07/27/20, MD    Allergies    Bactrim [sulfamethoxazole-trimethoprim] and Topiramate  Review of Systems   Review of Systems All other  systems reviewed and are negative except that which was mentioned in HPI  Physical Exam Updated Vital Signs BP 126/79   Pulse 60   Temp 98.7 F (37.1 C) (Oral)   Resp (!) 24   Ht 5\' 4"  (1.626 m)   Wt 104.3 kg   SpO2 100%   BMI 39.48 kg/m   Physical Exam Vitals and nursing note reviewed.  Constitutional:      General: She is not in acute distress.    Appearance: Normal appearance. She is well-developed. She is obese.     Comments: Awake, alert  HENT:     Head: Normocephalic and atraumatic.  Eyes:     Extraocular Movements: Extraocular movements intact.     Conjunctiva/sclera: Conjunctivae normal.     Pupils: Pupils are equal, round, and reactive to light.  Cardiovascular:     Rate and Rhythm: Normal rate and regular rhythm.     Heart sounds: Normal heart sounds. No murmur heard. Pulmonary:     Effort: Pulmonary effort is normal. No respiratory distress.     Breath sounds: Normal breath sounds.  Abdominal:     General: Bowel sounds are normal. There is no distension.     Palpations: Abdomen is soft.     Tenderness: There is no abdominal tenderness.  Musculoskeletal:     Cervical back: Neck supple.  Skin:    General: Skin is warm and dry.  Neurological:     Mental Status: She is alert and oriented to person, place, and time.     Cranial Nerves: No cranial nerve deficit.     Motor: No abnormal muscle tone.     Deep Tendon Reflexes: Reflexes are normal and symmetric.     Comments: Fluent speech, normal finger-to-nose testing, negative pronator drift, no clonus 5/5 strength and normal sensation x all 4 extremities  Psychiatric:        Thought Content: Thought content normal.        Judgment: Judgment normal.    ED Results / Procedures / Treatments   Labs (all labs ordered are listed, but only abnormal results are displayed) Labs Reviewed  COMPREHENSIVE METABOLIC PANEL - Abnormal; Notable for the following components:      Result Value   Alkaline Phosphatase 37 (*)     Total Bilirubin 0.2 (*)    All other components within normal limits  CBC  PREGNANCY, URINE  TROPONIN I (HIGH SENSITIVITY)    EKG None  Radiology No results found.  Procedures Procedures   Medications Ordered in ED Medications  0.9 %  sodium chloride infusion ( Intravenous Stopped 05/05/21 2009)  magnesium sulfate IVPB 2 g 50 mL (0 g Intravenous Stopped 05/05/21  2024)  dexamethasone (DECADRON) injection 10 mg (10 mg Intravenous Given 05/05/21 2002)    ED Course  I have reviewed the triage vital signs and the nursing notes.  Pertinent labs & imaging results that were available during my care of the patient were reviewed by me and considered in my medical decision making (see chart for details).    MDM Rules/Calculators/A&P                          Alert, neurologically intact on exam.  BP 152/92.  Screening lab work unremarkable including normal troponin and creatinine; reassuring against hypertensive emergency.  Chest tightness has been constant for over 8 hours and I feel that single troponin is sufficient.  Given atypical story, reassuring troponin, and reassuring EKG, I doubt ACS or heart strain due to hypertensive emergency.  Although she has no red flag neurologic symptoms to suggest intracranial hemorrhage, I did offer head CT and patient declined.  Her description is not highly suggestive of acute intracranial process.  Gave nonsedating medications for headache as patient drove herself here.  On reassessment, headache is improved and blood pressure has normalized.  Given her persistently elevated blood pressures at home, recommended that she increase hydrochlorothiazide dose from 12.5 to 25 mg daily and contact her PCP for follow-up this week.  Discussed that she may need to follow-up with her neurologist as well if headaches become a persistent problem.  I extensively reviewed return precautions with her and she voiced understanding. Final Clinical Impression(s) / ED  Diagnoses Final diagnoses:  Hypertension, unspecified type  Acute nonintractable headache, unspecified headache type    Rx / DC Orders ED Discharge Orders     None        Dixie Jafri, Ambrose Finland, MD 05/05/21 2204

## 2021-05-05 NOTE — Discharge Instructions (Addendum)
Please increase your dose of hydrochlorothiazide from 12.5 to 25mg  daily.  Follow-up with your primary care provider this week for further recommendations regarding her blood pressure control.  If you continue to have problems with headaches, please contact your neurologist.

## 2021-05-05 NOTE — ED Triage Notes (Signed)
Reports recent diagnosis of hypertension.  Took bp noted to be 202/108 at home. Also c/o chest tightness, headache for two days, pressure behind her eyes.

## 2021-05-07 DIAGNOSIS — I10 Essential (primary) hypertension: Secondary | ICD-10-CM | POA: Diagnosis not present

## 2021-05-08 ENCOUNTER — Ambulatory Visit: Payer: Self-pay | Admitting: Neurology

## 2021-05-16 ENCOUNTER — Telehealth: Payer: Self-pay | Admitting: Neurology

## 2021-05-16 NOTE — Telephone Encounter (Signed)
I called the number given ([facility]) and spoke with Marina Goodell to start PA for [phone]. She states that this will be for buy and bill, and the case will pend for clinical review. Reference #[id number].

## 2021-05-16 NOTE — Telephone Encounter (Signed)
Patient's next Botox appointment is 7/12. Patient now has [employer]. I called them at [phone] & spoke with DJ to check codes [id number] & [phone] for dx G43.711. DJ states I will need to speak with specialty @ [phone].

## 2021-05-22 ENCOUNTER — Ambulatory Visit: Payer: BC Managed Care – PPO | Admitting: Neurology

## 2021-05-22 DIAGNOSIS — G43711 Chronic migraine without aura, intractable, with status migrainosus: Secondary | ICD-10-CM | POA: Diagnosis not present

## 2021-05-22 NOTE — Progress Notes (Signed)
Consent Form Botulism Toxin Injection For Chronic Migraine  05/22/2021: stable. Has been life changing for her, significant improvement >>90%.  Had some migraines the last 2 weeks prior to this botox, wearing off of botox. Discussed Ajovy* in the future , she does not need it, botox alone is amazing. She takes [place] and Fioricet acutely.no masseters. +5U each orb oculi. Do the injections high up in the forehead she felt her eyebrows were slightly peaked  01/30/2021: Our second botox. Has been life changing for her, significant improvement >>90%.  Discussed Ajovy* in the future , she does not need it, botox alone is amazing. She takes [place] and Fioricet acutely.no masseters. +5U each orb oculi. Do the injections high up in the forehead she felt her eyebrows were slightly peaked.   Reviewed orally with patient, additionally signature is on file:  Botulism toxin has been approved by the Federal drug administration for treatment of chronic migraine. Botulism toxin does not cure chronic migraine and it may not be effective in some patients.  The administration of botulism toxin is accomplished by injecting a small amount of toxin into the muscles of the neck and head. Dosage must be titrated for each individual. Any benefits resulting from botulism toxin tend to wear off after 3 months with a repeat injection required if benefit is to be maintained. Injections are usually done every 3-4 months with maximum effect peak achieved by about 2 or 3 weeks. Botulism toxin is expensive and you should be sure of what costs you will incur resulting from the injection.  The side effects of botulism toxin use for chronic migraine may include:   -Transient, and usually mild, facial weakness with facial injections  -Transient, and usually mild, head or neck weakness with head/neck injections  -Reduction or loss of forehead facial animation due to forehead muscle weakness  -Eyelid drooping  -Dry eye  -Pain at the  site of injection or bruising at the site of injection  -Double vision  -Potential unknown long term risks  Contraindications: You should not have Botox if you are pregnant, nursing, allergic to albumin, have an infection, skin condition, or muscle weakness at the site of the injection, or have myasthenia gravis, Lambert-Eaton syndrome, or ALS.  It is also possible that as with any injection, there may be an allergic reaction or no effect from the medication. Reduced effectiveness after repeated injections is sometimes seen and rarely infection at the injection site may occur. All care will be taken to prevent these side effects. If therapy is given over a long time, atrophy and wasting in the muscle injected may occur. Occasionally the patient's become refractory to treatment because they develop antibodies to the toxin. In this event, therapy needs to be modified.  I have read the above information and consent to the administration of botulism toxin.    BOTOX PROCEDURE NOTE FOR MIGRAINE HEADACHE    Contraindications and precautions discussed with patient(above). Aseptic procedure was observed and patient tolerated procedure. Procedure performed by Dr. Artemio Aly  The condition has existed for more than 6 months, and pt does not have a diagnosis of ALS, Myasthenia Gravis or Lambert-Eaton Syndrome.  Risks and benefits of injections discussed and pt agrees to proceed with the procedure.  Written consent obtained  These injections are medically necessary. Pt  receives good benefits from these injections. These injections do not cause sedations or hallucinations which the oral therapies may cause.  Description of procedure:  The patient was placed in a  sitting position. The standard protocol was used for Botox as follows, with 5 units of Botox injected at each site:   -Procerus muscle, midline injection  -Corrugator muscle, bilateral injection  -Frontalis muscle, bilateral injection, with  2 sites each side, medial injection was performed in the upper one third of the frontalis muscle, in the region vertical from the medial inferior edge of the superior orbital rim. The lateral injection was again in the upper one third of the forehead vertically above the lateral limbus of the cornea, 1.5 cm lateral to the medial injection site.  -Temporalis muscle injection, 4 sites, bilaterally. The first injection was 3 cm above the tragus of the ear, second injection site was 1.5 cm to 3 cm up from the first injection site in line with the tragus of the ear. The third injection site was 1.5-3 cm forward between the first 2 injection sites. The fourth injection site was 1.5 cm posterior to the second injection site.   -Occipitalis muscle injection, 3 sites, bilaterally. The first injection was done one half way between the occipital protuberance and the tip of the mastoid process behind the ear. The second injection site was done lateral and superior to the first, 1 fingerbreadth from the first injection. The third injection site was 1 fingerbreadth superiorly and medially from the first injection site.  -Cervical paraspinal muscle injection, 2 sites, bilateral knee first injection site was 1 cm from the midline of the cervical spine, 3 cm inferior to the lower border of the occipital protuberance. The second injection site was 1.5 cm superiorly and laterally to the first injection site.  -Trapezius muscle injection was performed at 3 sites, bilaterally. The first injection site was in the upper trapezius muscle halfway between the inflection point of the neck, and the acromion. The second injection site was one half way between the acromion and the first injection site. The third injection was done between the first injection site and the inflection point of the neck.   Will return for repeat injection in 3 months.   200 units of Botox was used, any Botox not injected was wasted. The patient  tolerated the procedure well, there were no complications of the above procedure.

## 2021-05-22 NOTE — Telephone Encounter (Signed)
Received approval from [facility]. MMS ID #[id number], reference #[id number] (05/16/21- 05/15/22).

## 2021-05-22 NOTE — Progress Notes (Signed)
Botox- 200 units x 1 vial Lot: [id number] Expiration: 11/2023 NDC: 2330-0762-26  Bacteriostatic 0.9% Sodium Chloride- 62mL total [id number] Expiration: 10/2022 NDC: 38937-342-87  Dx: G43.711 BB

## 2021-05-28 DIAGNOSIS — I10 Essential (primary) hypertension: Secondary | ICD-10-CM | POA: Diagnosis not present

## 2021-06-21 ENCOUNTER — Other Ambulatory Visit: Payer: Self-pay | Admitting: Neurology

## 2021-06-21 MED ORDER — ONDANSETRON 4 MG PO TBDP: 4.0000 mg | ORAL_TABLET | Freq: Three times a day (TID) | ORAL | 3 refills | Status: DC | PRN

## 2021-06-21 MED ORDER — RIZATRIPTAN BENZOATE 10 MG PO TBDP: 10.0000 mg | ORAL_TABLET | ORAL | 11 refills | Status: DC | PRN

## 2021-06-27 ENCOUNTER — Ambulatory Visit: Payer: BC Managed Care – PPO | Admitting: Neurology

## 2021-06-27 ENCOUNTER — Encounter: Payer: Self-pay | Admitting: Neurology

## 2021-07-03 DIAGNOSIS — R0981 Nasal congestion: Secondary | ICD-10-CM | POA: Diagnosis not present

## 2021-07-03 DIAGNOSIS — R42 Dizziness and giddiness: Secondary | ICD-10-CM | POA: Diagnosis not present

## 2021-07-23 DIAGNOSIS — N925 Other specified irregular menstruation: Secondary | ICD-10-CM | POA: Diagnosis not present

## 2021-08-28 ENCOUNTER — Ambulatory Visit: Payer: BC Managed Care – PPO | Admitting: Neurology

## 2021-08-28 ENCOUNTER — Telehealth: Payer: Self-pay | Admitting: *Deleted

## 2021-08-28 ENCOUNTER — Encounter: Payer: Self-pay | Admitting: Neurology

## 2021-08-28 NOTE — Telephone Encounter (Signed)
Patient no showed Botox appointment today. Within past 1 yr pt unfortunately also no-showed an OV on 06/27/21 and canceled Botox same day 10/03/20.

## 2021-08-29 ENCOUNTER — Encounter: Payer: Self-pay | Admitting: Neurology

## 2021-10-16 DIAGNOSIS — R194 Change in bowel habit: Secondary | ICD-10-CM | POA: Diagnosis not present

## 2021-10-16 DIAGNOSIS — R1033 Periumbilical pain: Secondary | ICD-10-CM | POA: Diagnosis not present

## 2021-10-16 DIAGNOSIS — R140 Abdominal distension (gaseous): Secondary | ICD-10-CM | POA: Diagnosis not present

## 2021-10-24 DIAGNOSIS — L910 Hypertrophic scar: Secondary | ICD-10-CM | POA: Diagnosis not present

## 2021-10-24 DIAGNOSIS — Z789 Other specified health status: Secondary | ICD-10-CM | POA: Diagnosis not present

## 2021-10-24 DIAGNOSIS — L538 Other specified erythematous conditions: Secondary | ICD-10-CM | POA: Diagnosis not present

## 2021-11-07 DIAGNOSIS — E785 Hyperlipidemia, unspecified: Secondary | ICD-10-CM | POA: Diagnosis not present

## 2021-11-07 DIAGNOSIS — N946 Dysmenorrhea, unspecified: Secondary | ICD-10-CM | POA: Diagnosis not present

## 2021-11-15 DIAGNOSIS — N83292 Other ovarian cyst, left side: Secondary | ICD-10-CM | POA: Diagnosis not present

## 2021-11-15 DIAGNOSIS — R1032 Left lower quadrant pain: Secondary | ICD-10-CM | POA: Diagnosis not present

## 2021-11-22 DIAGNOSIS — L910 Hypertrophic scar: Secondary | ICD-10-CM | POA: Diagnosis not present

## 2021-11-27 ENCOUNTER — Ambulatory Visit: Payer: BC Managed Care – PPO | Admitting: Neurology
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
## 2021-12-07 DIAGNOSIS — N898 Other specified noninflammatory disorders of vagina: Secondary | ICD-10-CM | POA: Diagnosis not present

## 2021-12-07 DIAGNOSIS — Z3202 Encounter for pregnancy test, result negative: Secondary | ICD-10-CM | POA: Diagnosis not present

## 2021-12-07 DIAGNOSIS — R300 Dysuria: Secondary | ICD-10-CM | POA: Diagnosis not present

## 2022-01-06 ENCOUNTER — Telehealth: Payer: BC Managed Care – PPO | Admitting: Emergency Medicine

## 2022-01-06 DIAGNOSIS — B9689 Other specified bacterial agents as the cause of diseases classified elsewhere: Secondary | ICD-10-CM | POA: Diagnosis not present

## 2022-01-06 DIAGNOSIS — N760 Acute vaginitis: Secondary | ICD-10-CM | POA: Diagnosis not present

## 2022-01-06 MED ORDER — CLINDAMYCIN HCL 300 MG PO CAPS: 300.0000 mg | ORAL_CAPSULE | Freq: Three times a day (TID) | ORAL | 0 refills | Status: AC

## 2022-01-06 NOTE — Patient Instructions (Signed)
°  [name], thank you for joining Rennis Harding, PA-C for today's virtual visit.  While this provider is not your primary care provider (PCP), if your PCP is located in our provider database this encounter information will be shared with them immediately following your visit.  Consent: (Patient) [name] provided verbal consent for this virtual visit at the beginning of the encounter.  Current Medications:  Current Outpatient Medications:    clindamycin (CLEOCIN) 300 MG capsule, Take 1 capsule (300 mg total) by mouth 3 (three) times daily for 7 days., Disp: 21 capsule, Rfl: 0   Butalbital-APAP-Caffeine (FIORICET) 50-300-40 MG CAPS, 1 capsule as needed, Disp: , Rfl:    clindamycin (CLEOCIN) 2 % vaginal cream, Place 1 Applicatorful vaginally at bedtime. Insert 1 Applicatorful of medication into the vagina once nightly x 7 nights, Disp: 35 g, Rfl: 0   fluticasone (FLONASE) 50 MCG/ACT nasal spray, Place 2 sprays into both nostrils daily., Disp: 16 g, Rfl: 0   Levonorgest-Eth Estrad 91-Day (SEASONIQUE PO), Seasonique 0.15 mg-30 mcg (84)/10 mcg(7) tablets,3 month dose pack  Take 1 tablet every day by oral route., Disp: , Rfl:    ondansetron (ZOFRAN-ODT) 4 MG disintegrating tablet, Take 1-2 tablets (4-8 mg total) by mouth every 8 (eight) hours as needed. For migraine and/or nausea. May take together with Rizatriptan at onset of migraine., Disp: 30 tablet, Rfl: 3   rizatriptan (MAXALT-MLT) 10 MG disintegrating tablet, Take 1 tablet (10 mg total) by mouth as needed for migraine. May repeat in 2 hours if needed. May take with Ondansetron., Disp: 9 tablet, Rfl: 11   Ubrogepant (UBRELVY) 100 MG TABS, Take 100 mg by mouth every 2 (two) hours as needed. Maximum 200mg  a day., Disp: 10 tablet, Rfl: 6   Medications ordered in this encounter:  Meds ordered this encounter  Medications   clindamycin (CLEOCIN) 300 MG capsule    Sig: Take 1 capsule (300 mg total) by mouth 3 (three) times daily for 7  days.    Dispense:  21 capsule    Refill:  0    Order Specific Question:   Supervising Provider    Answer:   , BRIAN [[id number]]     *If you need refills on other medications prior to your next appointment, please contact your pharmacy*  Follow-Up: Call back or seek an in-person evaluation if the symptoms worsen or if the condition fails to improve as anticipated.  Other Instructions Symptoms and history sound consistent with BV Clindamycin prescribed.  Take as directed and to completion Follow up in person at urgent care or with PCP if symptoms persists or do not improve with prescribed medications.     If you have been instructed to have an in-person evaluation today at a local Urgent Care facility, please use the link below. It will take you to a list of all of our available [facility], including address, phone number and hours of operation. Please do not delay care.  [facility]  If you or a family member do not have a primary care provider, use the link below to schedule a visit and establish care. When you choose a [facility] primary care physician or advanced practice provider, you gain a long-term partner in health. Find a Primary Care Provider  Learn more about [facility]'s in-office and virtual care options: [facility] - Get Care Now

## 2022-01-06 NOTE — Progress Notes (Signed)
Virtual Visit Consent   [name], you are scheduled for a virtual visit with a [facility] provider today.     Just as with appointments in the office, your consent must be obtained to participate.  Your consent will be active for this visit and any virtual visit you may have with one of our providers in the next 365 days.     If you have a MyChart account, a copy of this consent can be sent to you electronically.  All virtual visits are billed to your insurance company just like a traditional visit in the office.    As this is a virtual visit, video technology does not allow for your provider to perform a traditional examination.  This may limit your provider's ability to fully assess your condition.  If your provider identifies any concerns that need to be evaluated in person or the need to arrange testing (such as labs, EKG, etc.), we will make arrangements to do so.     Although advances in technology are sophisticated, we cannot ensure that it will always work on either your end or our end.  If the connection with a video visit is poor, the visit may have to be switched to a telephone visit.  With either a video or telephone visit, we are not always able to ensure that we have a secure connection.     I need to obtain your verbal consent now.   Are you willing to proceed with your visit today? Yes   [name] has provided verbal consent on 01/06/2022 for a virtual visit (video or telephone).   Rennis Harding, [place]   Date: 01/06/2022 10:36 AM   Virtual Visit via Video Note   I, Rennis Harding, connected with  [name]  ([id number], 09-03-1986) on 01/06/22 at 10:30 AM EST by a video-enabled telemedicine application and verified that I am speaking with the correct person using two identifiers.  Location: Patient: Virtual Visit Location Patient: Home Provider: Virtual Visit Location Provider: Home Office   I discussed the limitations of evaluation and management by  telemedicine and the availability of in person appointments. The patient expressed understanding and agreed to proceed.    History of Present Illness: [name] is a 36 y.o. who identifies as a female who was assigned female at birth, and is being seen today for vaginal itching, fishy odor, and gray discharge x 2 days.  She denies a precipitating event, recent sexual encounter or recent antibiotic use.  Denies concern for STIs.  She has tried boric acid without relief.  Denies aggravating factors.  She reports similar symptoms in the past and was diagnosed with BV.  She denies fever, chills, nausea, vomiting, vaginal bleeding, dyspareunia, vaginal lesions.   HPI: HPI  Problems:  Patient Active Problem List   Diagnosis Date Noted   Vitamin D deficiency 02/05/2021   Essential (primary) hypertension 02/05/2021   Migraine 01/24/2021   Chronic migraine without aura, with intractable migraine, so stated, with status migrainosus 07/25/2020   Obesity (BMI 30-39.9) 06/21/2019   Tension headache 06/21/2019   Stress reaction 06/21/2019   Family history of colon cancer 05/05/2015   Menorrhagia with regular cycle 05/05/2015   Gastroesophageal reflux disease 05/05/2015   Seasonal allergic rhinitis 02/17/2015   Migraine without aura and without status migrainosus, not intractable 07/01/2014    Allergies:  Allergies  Allergen Reactions   Bactrim [Sulfamethoxazole-Trimethoprim]     Chills, joint pain, sweating, headache  Topiramate Rash   Medications:  Current Outpatient Medications:    clindamycin (CLEOCIN) 300 MG capsule, Take 1 capsule (300 mg total) by mouth 3 (three) times daily for 7 days., Disp: 21 capsule, Rfl: 0   Butalbital-APAP-Caffeine (FIORICET) 50-300-40 MG CAPS, 1 capsule as needed, Disp: , Rfl:    clindamycin (CLEOCIN) 2 % vaginal cream, Place 1 Applicatorful vaginally at bedtime. Insert 1 Applicatorful of medication into the vagina once nightly x 7 nights, Disp: 35 g, Rfl:  0   fluticasone (FLONASE) 50 MCG/ACT nasal spray, Place 2 sprays into both nostrils daily., Disp: 16 g, Rfl: 0   Levonorgest-Eth Estrad 91-Day (SEASONIQUE PO), Seasonique 0.15 mg-30 mcg (84)/10 mcg(7) tablets,3 month dose pack  Take 1 tablet every day by oral route., Disp: , Rfl:    ondansetron (ZOFRAN-ODT) 4 MG disintegrating tablet, Take 1-2 tablets (4-8 mg total) by mouth every 8 (eight) hours as needed. For migraine and/or nausea. May take together with Rizatriptan at onset of migraine., Disp: 30 tablet, Rfl: 3   rizatriptan (MAXALT-MLT) 10 MG disintegrating tablet, Take 1 tablet (10 mg total) by mouth as needed for migraine. May repeat in 2 hours if needed. May take with Ondansetron., Disp: 9 tablet, Rfl: 11   Ubrogepant (UBRELVY) 100 MG TABS, Take 100 mg by mouth every 2 (two) hours as needed. Maximum 200mg  a day., Disp: 10 tablet, Rfl: 6  Observations/Objective: Patient is well-developed, well-nourished in no acute distress.  Resting comfortably at home.  Head is normocephalic, atraumatic.  No labored breathing. Speaking in full sentences and tolerating own secretions Speech is clear and coherent with logical content.  Patient is alert and oriented at baseline.   Assessment and Plan: 1. Bacterial vaginosis  Symptoms and history sound consistent with BV Clindamycin prescribed.  Take as directed and to completion Follow up in person at urgent care or with PCP if symptoms persists or do not improve with prescribed medications.    Follow Up Instructions: I discussed the assessment and treatment plan with the patient. The patient was provided an opportunity to ask questions and all were answered. The patient agreed with the plan and demonstrated an understanding of the instructions.  A copy of instructions were sent to the patient via MyChart unless otherwise noted below.   The patient was advised to call back or seek an in-person evaluation if the symptoms worsen or if the condition fails  to improve as anticipated.  Time:  I spent 5-10 minutes with the patient via telehealth technology discussing the above problems/concerns.    , PA-C

## 2022-01-16 DIAGNOSIS — I10 Essential (primary) hypertension: Secondary | ICD-10-CM | POA: Diagnosis not present

## 2022-01-16 DIAGNOSIS — Z01419 Encounter for gynecological examination (general) (routine) without abnormal findings: Secondary | ICD-10-CM | POA: Diagnosis not present

## 2022-01-16 DIAGNOSIS — N898 Other specified noninflammatory disorders of vagina: Secondary | ICD-10-CM | POA: Diagnosis not present

## 2022-01-24 DIAGNOSIS — N912 Amenorrhea, unspecified: Secondary | ICD-10-CM | POA: Diagnosis not present

## 2022-01-29 DIAGNOSIS — Z3201 Encounter for pregnancy test, result positive: Secondary | ICD-10-CM | POA: Diagnosis not present

## 2022-01-30 ENCOUNTER — Telehealth: Payer: BC Managed Care – PPO | Admitting: Family

## 2022-01-30 DIAGNOSIS — B9689 Other specified bacterial agents as the cause of diseases classified elsewhere: Secondary | ICD-10-CM

## 2022-01-30 DIAGNOSIS — N898 Other specified noninflammatory disorders of vagina: Secondary | ICD-10-CM

## 2022-01-30 DIAGNOSIS — N760 Acute vaginitis: Secondary | ICD-10-CM | POA: Diagnosis not present

## 2022-01-30 MED ORDER — METRONIDAZOLE 500 MG PO TABS: 500.0000 mg | ORAL_TABLET | Freq: Two times a day (BID) | ORAL | 0 refills | Status: DC

## 2022-01-30 NOTE — Patient Instructions (Signed)
Bacterial Vaginosis °Bacterial vaginosis is an infection that occurs when the normal balance of bacteria in the vagina changes. This change is caused by an overgrowth of certain bacteria in the vagina. Bacterial vaginosis is the most common vaginal infection among females aged 34 to 44 years. °This condition increases the risk of sexually transmitted infections (STIs). Treatment can help reduce this risk. Treatment is very important for pregnant women because this condition can cause babies to be born early (prematurely) or at a low birth weight. °What are the causes? °This condition is caused by an increase in harmful bacteria that are normally present in small amounts in the vagina. However, the exact reason this condition develops is not known. °You cannot get bacterial vaginosis from toilet seats, bedding, swimming pools, or contact with objects around you. °What increases the risk? °The following factors may make you more likely to develop this condition: °Having a new sexual partner or multiple sexual partners, or having unprotected sex. °Douching. °Having an intrauterine device (IUD). °Smoking. °Abusing drugs and alcohol. This may lead to riskier sexual behavior. °Taking certain antibiotic medicines. °Being pregnant. °What are the signs or symptoms? °Some women with this condition have no symptoms. Symptoms may include: °Gray or white vaginal discharge. The discharge can be watery or foamy. °A fish-like odor with discharge, especially after sex or during menstruation. °Itching in and around the vagina. °Burning or pain with urination. °How is this diagnosed? °This condition is diagnosed based on: °Your medical history. °A physical exam of the vagina. °Checking a sample of vaginal fluid for harmful bacteria or abnormal cells. °How is this treated? °This condition is treated with antibiotic medicines. These may be given as a pill, a vaginal cream, or a medicine that is put into the vagina (suppository). If the  condition comes back after treatment, a second round of antibiotics may be needed. °Follow these instructions at home: °Medicines °Take or apply over-the-counter and prescription medicines only as told by your health care provider. °Take or apply your antibiotic medicine as told by your health care provider. Do not stop using the antibiotic even if you start to feel better. °General instructions °If you have a female sexual partner, tell her that you have a vaginal infection. She should follow up with her health care provider. If you have a female sexual partner, he does not need treatment. °Avoid sexual activity until you finish treatment. °Drink enough fluid to keep your urine pale yellow. °Keep the area around your vagina and rectum clean. °Wash the area daily with warm water. °Wipe yourself from front to back after using the toilet. °If you are breastfeeding, talk to your health care provider about continuing breastfeeding during treatment. °Keep all follow-up visits. This is important. °How is this prevented? °Self-care °Do not douche. °Wash the outside of your vagina with warm water only. °Wear cotton or cotton-lined underwear. °Avoid wearing tight pants and pantyhose, especially during the summer. °Safe sex °Use protection when having sex. This includes: °Using condoms. °Using dental dams. This is a thin layer of a material made of latex or polyurethane that protects the mouth during oral sex. °Limit the number of sexual partners. To help prevent bacterial vaginosis, it is best to have sex with just one partner (monogamous relationship). °Make sure you and your sexual partner are tested for STIs. °Drugs and alcohol °Do not use any products that contain nicotine or tobacco. These products include cigarettes, chewing tobacco, and vaping devices, such as e-cigarettes. If you need help quitting,   ask your health care provider. °Do not use drugs. °Do not drink alcohol if: °Your health care provider tells you not to  do this. °You are pregnant, may be pregnant, or are planning to become pregnant. °If you drink alcohol: °Limit how much you have to 0-1 drink a day. °Be aware of how much alcohol is in your drink. In the [place] one drink equals one 12 oz bottle of beer (355 mL), one 5 oz glass of wine (148 mL), or one 1½ oz glass of hard liquor (44 mL). °Where to find more information °Centers for Disease Control and Prevention: www.cdc.gov °American Sexual Health Association (ASHA): www.ashastd.org °[place] Department of Health and Human Services, Office on Women's Health: www.womenshealth.gov °Contact a health care provider if: °Your symptoms do not improve, even after treatment. °You have more discharge or pain when urinating. °You have a fever or chills. °You have pain in your abdomen or pelvis. °You have pain during sex. °You have vaginal bleeding between menstrual periods. °Summary °Bacterial vaginosis is a vaginal infection that occurs when the normal balance of bacteria in the vagina changes. It results from an overgrowth of certain bacteria. °This condition increases the risk of sexually transmitted infections (STIs). Getting treated can help reduce this risk. °Treatment is very important for pregnant women because this condition can cause babies to be born early (prematurely) or at low birth weight. °This condition is treated with antibiotic medicines. These may be given as a pill, a vaginal cream, or a medicine that is put into the vagina (suppository). °This information is not intended to replace advice given to you by your health care provider. Make sure you discuss any questions you have with your health care provider. °Document Revised: 04/27/2020 Document Reviewed: 04/27/2020 °Elsevier Patient Education © 2022 Elsevier Inc. ° °

## 2022-01-30 NOTE — Progress Notes (Signed)
?Virtual Visit Consent  ? ?[name], you are scheduled for a virtual visit with a [facility] provider today.   ?  ?Just as with appointments in the office, your consent must be obtained to participate.  Your consent will be active for this visit and any virtual visit you may have with one of our providers in the next 365 days.   ?  ?If you have a MyChart account, a copy of this consent can be sent to you electronically.  All virtual visits are billed to your insurance company just like a traditional visit in the office.   ? ?As this is a virtual visit, video technology does not allow for your provider to perform a traditional examination.  This may limit your provider's ability to fully assess your condition.  If your provider identifies any concerns that need to be evaluated in person or the need to arrange testing (such as labs, EKG, etc.), we will make arrangements to do so.   ?  ?Although advances in technology are sophisticated, we cannot ensure that it will always work on either your end or our end.  If the connection with a video visit is poor, the visit may have to be switched to a telephone visit.  With either a video or telephone visit, we are not always able to ensure that we have a secure connection.    ? ?I need to obtain your verbal consent now.   Are you willing to proceed with your visit today?  ?  ?[name] has provided verbal consent on 01/30/2022 for a virtual visit (video or telephone). ?  ?Pamela Rodney, FNP  ? ?Date: 01/30/2022 4:20 PM ? ? ?Virtual Visit via Video Note  ? ?IJannifer Nelson, connected with  [name]  ([id number], 01/28/1986) on 01/30/22 at  4:15 PM EDT by a video-enabled telemedicine application and verified that I am speaking with the correct person using two identifiers. ? ?Location: ?Patient: Virtual Visit Location Patient: Home ?Provider: Virtual Visit Location Provider: Home Office ?  ?I discussed the limitations of evaluation and management by  telemedicine and the availability of in person appointments. The patient expressed understanding and agreed to proceed.   ? ?History of Present Illness: ?[name] is a 36 y.o. who identifies as a female who was assigned female at birth, and is being seen today for vaginal discharge. ? ?HPI: Vaginal Discharge ?The patient's primary symptoms include a genital odor and vaginal discharge. The patient's pertinent negatives include no genital itching, pelvic pain or vaginal bleeding. This is a new problem. The current episode started in the past 7 days. The problem has been gradually worsening. The patient is experiencing no pain. Associated symptoms include frequency. Pertinent negatives include no chills, constipation, diarrhea, discolored urine, dysuria or painful intercourse.   ?Problems:  ?Patient Active Problem List  ? Diagnosis Date Noted  ? Vitamin D deficiency 02/05/2021  ? Essential (primary) hypertension 02/05/2021  ? Migraine 01/24/2021  ? Chronic migraine without aura, with intractable migraine, so stated, with status migrainosus 07/25/2020  ? Obesity (BMI 30-39.9) 06/21/2019  ? Tension headache 06/21/2019  ? Stress reaction 06/21/2019  ? Family history of colon cancer 05/05/2015  ? Menorrhagia with regular cycle 05/05/2015  ? Gastroesophageal reflux disease 05/05/2015  ? Seasonal allergic rhinitis 02/17/2015  ? Migraine without aura and without status migrainosus, not intractable 07/01/2014  ?  ?Allergies:  ?Allergies  ?Allergen Reactions  ? Bactrim [Sulfamethoxazole-Trimethoprim]   ?  Chills,  joint pain, sweating, headache  ? Topiramate Rash  ? ?Medications:  ?Current Outpatient Medications:  ?  metroNIDAZOLE (FLAGYL) 500 MG tablet, Take 1 tablet (500 mg total) by mouth 2 (two) times daily., Disp: 14 tablet, Rfl: 0 ?  ondansetron (ZOFRAN-ODT) 4 MG disintegrating tablet, Take 1-2 tablets (4-8 mg total) by mouth every 8 (eight) hours as needed. For migraine and/or nausea. May take together with  Rizatriptan at onset of migraine., Disp: 30 tablet, Rfl: 3 ?  rizatriptan (MAXALT-MLT) 10 MG disintegrating tablet, Take 1 tablet (10 mg total) by mouth as needed for migraine. May repeat in 2 hours if needed. May take with Ondansetron., Disp: 9 tablet, Rfl: 11 ? ?Observations/Objective: ?Patient is well-developed, well-nourished in no acute distress.  ?Resting comfortably  at home.  ?Head is normocephalic, atraumatic.  ?No labored breathing.  ?Speech is clear and coherent with logical content.  ?Patient is alert and oriented at baseline.  ? ? ?Assessment and Plan: ?1. BV (bacterial vaginosis) ?- metroNIDAZOLE (FLAGYL) 500 MG tablet; Take 1 tablet (500 mg total) by mouth 2 (two) times daily.  Dispense: 14 tablet; Refill: 0 ? ?2. Vaginal discharge ?- metroNIDAZOLE (FLAGYL) 500 MG tablet; Take 1 tablet (500 mg total) by mouth 2 (two) times daily.  Dispense: 14 tablet; Refill: 0 ? ?Do not douche ?Safe sex ?Start Flagyl  ?Follow up if symptoms worsen or do not improve  ? ?Follow Up Instructions: ?I discussed the assessment and treatment plan with the patient. The patient was provided an opportunity to ask questions and all were answered. The patient agreed with the plan and demonstrated an understanding of the instructions.  A copy of instructions were sent to the patient via MyChart unless otherwise noted below.  ? ? ? ?The patient was advised to call back or seek an in-person evaluation if the symptoms worsen or if the condition fails to improve as anticipated. ? ?Time:  ?I spent 11 minutes with the patient via telehealth technology discussing the above problems/concerns.   ? ?Evelina Dun, FNP ? ?

## 2022-01-31 ENCOUNTER — Ambulatory Visit: Payer: BC Managed Care – PPO | Admitting: Cardiology

## 2022-01-31 ENCOUNTER — Encounter: Payer: Self-pay | Admitting: Cardiology

## 2022-01-31 ENCOUNTER — Other Ambulatory Visit: Payer: Self-pay

## 2022-01-31 VITALS — BP 124/96 | HR 74 | Ht 64.0 in | Wt 217.8 lb

## 2022-01-31 DIAGNOSIS — O10919 Unspecified pre-existing hypertension complicating pregnancy, unspecified trimester: Secondary | ICD-10-CM

## 2022-01-31 DIAGNOSIS — E669 Obesity, unspecified: Secondary | ICD-10-CM | POA: Diagnosis not present

## 2022-01-31 MED ORDER — NIFEDIPINE ER OSMOTIC RELEASE 30 MG PO TB24: 30.0000 mg | ORAL_TABLET | Freq: Every day | ORAL | 3 refills | Status: DC

## 2022-01-31 NOTE — Patient Instructions (Addendum)
Medication Instructions:  ?START: NIFEDIPINE 30mg  ONCE DAILY  ?*If you need a refill on your cardiac medications before your next appointment, please call your pharmacy* ? ?Follow-Up: ?At [facility], you and your health needs are our priority.  As part of our continuing mission to provide you with exceptional heart care, we have created designated Provider Care Teams.  These Care Teams include your primary Cardiologist (physician) and Advanced Practice Providers (APPs -  Physician Assistants and Nurse Practitioners) who all work together to provide you with the care you need, when you need it. ? ?Your next appointment:   ?12 week(s)  ? ?The format for your next appointment:   ?In Person ? ?Provider:   ?[facility], DO   ? ?Please check your blood pressure at home daily, write it down.  Call the office or send message via Mychart with the readings in 2 weeks for Dr. Thomasene Ripple to review.  ? ?

## 2022-01-31 NOTE — Progress Notes (Signed)
?Cardio-Obstetrics Clinic ? ?New Evaluation ? ?Date:  02/01/2022  ? ?ID:  [name], DOB 1986-10-26, MRN [id number] ? ?PCP:  Irena Reichmannollins, Dana, DO ?  ?[facility] Providers ?Cardiologist:  Thomasene RippleKardie Kriste Broman, DO  ?Electrophysiologist:  None      ? ?Referring MD: Maxie Betterousins, Sheronette, MD  ? ?Chief Complaint:  ? ?History of Present Illness:   ? ?[name] is a 36 y.o. female [G4P1021] who is being seen today for the evaluation of chronic hypertension at the request of Maxie Betterousins, Sheronette, MD.  ? ?Medical history includes hypertension -she was on olmesartan and hydrochlorothiazide.  Since having a positive pregnancy test.  She is not having any other symptoms.  The major reason for her visit is to review her medications and to treat the patient for hypertension in pregnancy. ? ?She is currently [redacted] weeks pregnant.  With her second baby.  She has a 34-year-old son. ? ?Prior CV Studies Reviewed: ?The following studies were reviewed today: None today ? ? ?Past Medical History:  ?Diagnosis Date  ? Anxiety   ? H/O seasonal allergies   ? Hypertension   ? Migraine   ? ? ?Past Surgical History:  ?Procedure Laterality Date  ? CESAREAN SECTION N/A 04/30/2013  ? Procedure: CESAREAN SECTION;  Surgeon: Kirkland HunArthur Stringer, MD;  Location: [facility];  Service: Obstetrics;  Laterality: N/A;  ?   ? ?OB History   ? ? Gravida  ?4  ? Para  ?1  ? Term  ?1  ? Preterm  ?   ? AB  ?2  ? Living  ?1  ?  ? ? SAB  ?   ? IAB  ?1  ? Ectopic  ?   ? Multiple  ?   ? Live Births  ?1  ?   ?  ?  ?    ? ? ?Current Medications: ?Current Meds  ?Medication Sig  ? metroNIDAZOLE (FLAGYL) 500 MG tablet Take 1 tablet (500 mg total) by mouth 2 (two) times daily.  ? NIFEdipine (PROCARDIA-XL/NIFEDICAL-XL) 30 MG 24 hr tablet Take 1 tablet (30 mg total) by mouth daily.  ? Prenatal Vit-Fe Fumarate-FA (PRENATAL VITAMIN PO) Take by mouth.  ?  ? ?Allergies:   Bactrim [sulfamethoxazole-trimethoprim] and Topiramate  ? ?Social History  ? ?Socioeconomic History  ? Marital status:  Single  ?  Spouse name: Not on file  ? Number of children: 1  ? Years of education: Not on file  ? Highest education level: Not on file  ?Occupational History  ? Occupation: human resources  ?Tobacco Use  ? Smoking status: Never  ? Smokeless tobacco: Never  ?Vaping Use  ? Vaping Use: Never used  ?Substance and Sexual Activity  ? Alcohol use: Yes  ?  Comment: occ  ? Drug use: No  ? Sexual activity: Not Currently  ?  Birth control/protection: None  ?Other Topics Concern  ? Not on file  ?Social History Narrative  ? Lives with her child  ? Right handed  ? Caffeine: 1-2 cups/day  ? ?Social Determinants of Health  ? ?Financial Resource Strain: Not on file  ?Food Insecurity: No Food Insecurity  ? Worried About Programme researcher, broadcasting/film/videounning Out of Food in the Last Year: Never true  ? Ran Out of Food in the Last Year: Never true  ?Transportation Needs: No Transportation Needs  ? Lack of Transportation (Medical): No  ? Lack of Transportation (Non-Medical): No  ?Physical Activity: Not on file  ?Stress: Not on file  ?Social Connections: Not on  file  ?  ? ? ?Family History  ?Problem Relation Age of Onset  ? Healthy Mother   ? Migraines Mother   ? Prostate cancer Father   ? Healthy Son   ? Colon cancer Paternal Grandmother   ? Migraines Maternal Grandmother   ? Heart disease Neg Hx   ? Hypertension Neg Hx   ?   ? ?ROS:   ?Please see the history of present illness.    ? ?All other systems reviewed and are negative. ? ? ?Labs/EKG Reviewed:   ? ?EKG:   ?EKG is was  ordered today.  The ekg ordered today demonstrates sinus rhythm, heart rate 74 bpm. ? ?Recent Labs: ?05/05/2021: ALT 17; BUN 12; Creatinine, Ser 0.92; Hemoglobin 12.3; Platelets 210; Potassium 3.8; Sodium 135  ? ?Recent Lipid Panel ?No results found for: CHOL, TRIG, HDL, CHOLHDL, LDLCALC, LDLDIRECT ? ?Physical Exam:   ? ?VS:  BP (!) 124/96 (BP Location: Right Arm, Patient Position: Sitting, Cuff Size: Large)   Pulse 74   Ht 5\' 4"  (1.626 m)   Wt 217 lb 12.8 oz (98.8 kg)   LMP 12/21/2021    SpO2 98%   BMI 37.39 kg/m?    ? ?Wt Readings from Last 3 Encounters:  ?01/31/22 217 lb 12.8 oz (98.8 kg)  ?05/05/21 230 lb (104.3 kg)  ?07/25/20 208 lb (94.3 kg)  ?  ? ?GEN:  Well nourished, well developed in no acute distress ?HEENT: Normal ?NECK: No JVD; No carotid bruits ?LYMPHATICS: No lymphadenopathy ?CARDIAC: RRR, no murmurs, rubs, gallops ?RESPIRATORY:  Clear to auscultation without rales, wheezing or rhonchi  ?ABDOMEN: Soft, non-tender, non-distended ?MUSCULOSKELETAL:  No edema; No deformity  ?SKIN: Warm and dry ?NEUROLOGIC:  Alert and oriented x 3 ?PSYCHIATRIC:  Normal affect  ? ? ?Risk Assessment/Risk Calculators:   ?  ?CARPREG II ?Risk Prediction Index Score:  1.  The patient's risk for a primary cardiac event is 5%. ?  ?   ?  ?  ? ? ?ASSESSMENT & PLAN:   ? ?Chronic Hypertension in pregnancy  ?Obesity  ? ?She is off all of her antihypertensive medication.  She has some diastolic pretension in the office today.  With her known chronic hypertension it would be best to restart the patient on low-dose antihypertensive we will start nifedipine 30 mg daily. ? ?We also talked about preeclampsia prophylaxis once it is appropriate.  Aspirin 81 mg daily after 8 weeks of gestation.  She will discuss also with her OB. ? ?Keep weight gain between 11 to 20 pounds. ? ?I also congratulated the patient today because she approaches her first home.  She is very excited.  Happy for her. ? ?The patient is in agreement with the above plan. The patient left the office in stable condition.  The patient will follow up in 12 weeks or  ? ?Patient Instructions  ?Medication Instructions:  ?START: NIFEDIPINE 30mg  ONCE DAILY  ?*If you need a refill on your cardiac medications before your next appointment, please call your pharmacy* ? ?Follow-Up: ?At [facility], you and your health needs are our priority.  As part of our continuing mission to provide you with exceptional heart care, we have created designated Provider Care Teams.   These Care Teams include your primary Cardiologist (physician) and Advanced Practice Providers (APPs -  Physician Assistants and Nurse Practitioners) who all work together to provide you with the care you need, when you need it. ? ?Your next appointment:   ?12 week(s)  ? ?The  format for your next appointment:   ?In Person ? ?Provider:   ?Thomasene Ripple, DO   ? ?Please check your blood pressure at home daily, write it down.  Call the office or send message via Mychart with the readings in 2 weeks for Dr. Servando Salina to review.  ? ? ? ?Dispo:  No follow-ups on file.  ? ?Medication Adjustments/Labs and Tests Ordered: ?Current medicines are reviewed at length with the patient today.  Concerns regarding medicines are outlined above.  ?Tests Ordered: ?Orders Placed This Encounter  ?Procedures  ? EKG 12-Lead  ? ?Medication Changes: ?Meds ordered this encounter  ?Medications  ? NIFEdipine (PROCARDIA-XL/NIFEDICAL-XL) 30 MG 24 hr tablet  ?  Sig: Take 1 tablet (30 mg total) by mouth daily.  ?  Dispense:  30 tablet  ?  Refill:  3  ? ?

## 2022-02-01 DIAGNOSIS — O10919 Unspecified pre-existing hypertension complicating pregnancy, unspecified trimester: Secondary | ICD-10-CM | POA: Insufficient documentation

## 2022-02-01 DIAGNOSIS — Z3201 Encounter for pregnancy test, result positive: Secondary | ICD-10-CM | POA: Diagnosis not present

## 2022-02-06 DIAGNOSIS — O2690 Pregnancy related conditions, unspecified, unspecified trimester: Secondary | ICD-10-CM | POA: Diagnosis not present

## 2022-02-07 DIAGNOSIS — R102 Pelvic and perineal pain: Secondary | ICD-10-CM | POA: Diagnosis not present

## 2022-02-07 DIAGNOSIS — O26891 Other specified pregnancy related conditions, first trimester: Secondary | ICD-10-CM | POA: Diagnosis not present

## 2022-02-07 DIAGNOSIS — O3680X0 Pregnancy with inconclusive fetal viability, not applicable or unspecified: Secondary | ICD-10-CM | POA: Diagnosis not present

## 2022-02-08 ENCOUNTER — Ambulatory Visit: Payer: BC Managed Care – PPO | Admitting: Cardiology

## 2022-02-11 DIAGNOSIS — O3680X0 Pregnancy with inconclusive fetal viability, not applicable or unspecified: Secondary | ICD-10-CM | POA: Diagnosis not present

## 2022-02-13 DIAGNOSIS — N939 Abnormal uterine and vaginal bleeding, unspecified: Secondary | ICD-10-CM | POA: Diagnosis not present

## 2022-02-18 ENCOUNTER — Ambulatory Visit: Payer: BC Managed Care – PPO | Admitting: Cardiology

## 2022-02-19 DIAGNOSIS — N939 Abnormal uterine and vaginal bleeding, unspecified: Secondary | ICD-10-CM | POA: Diagnosis not present

## 2022-02-19 DIAGNOSIS — O2690 Pregnancy related conditions, unspecified, unspecified trimester: Secondary | ICD-10-CM | POA: Diagnosis not present

## 2022-02-20 DIAGNOSIS — L309 Dermatitis, unspecified: Secondary | ICD-10-CM | POA: Diagnosis not present

## 2022-02-26 ENCOUNTER — Telehealth: Payer: BC Managed Care – PPO | Admitting: Family Medicine

## 2022-02-26 DIAGNOSIS — B3731 Acute candidiasis of vulva and vagina: Secondary | ICD-10-CM | POA: Diagnosis not present

## 2022-02-26 DIAGNOSIS — N898 Other specified noninflammatory disorders of vagina: Secondary | ICD-10-CM

## 2022-02-26 NOTE — Progress Notes (Signed)
[place]  ? ?Recent pregnancy listed in chart, question to pregnancy answered was no. ?In person eval for pregnancy rule out and treatment advised. ? ?Message sent via mychart  ?

## 2022-02-27 ENCOUNTER — Encounter: Payer: Self-pay | Admitting: Cardiology

## 2022-03-01 DIAGNOSIS — R7309 Other abnormal glucose: Secondary | ICD-10-CM | POA: Diagnosis not present

## 2022-03-01 DIAGNOSIS — Z79899 Other long term (current) drug therapy: Secondary | ICD-10-CM | POA: Diagnosis not present

## 2022-03-01 DIAGNOSIS — H9312 Tinnitus, left ear: Secondary | ICD-10-CM | POA: Diagnosis not present

## 2022-03-01 DIAGNOSIS — Z13220 Encounter for screening for lipoid disorders: Secondary | ICD-10-CM | POA: Diagnosis not present

## 2022-03-06 DIAGNOSIS — O039 Complete or unspecified spontaneous abortion without complication: Secondary | ICD-10-CM | POA: Diagnosis not present

## 2022-04-17 DIAGNOSIS — Z01419 Encounter for gynecological examination (general) (routine) without abnormal findings: Secondary | ICD-10-CM | POA: Diagnosis not present

## 2022-04-17 DIAGNOSIS — Z113 Encounter for screening for infections with a predominantly sexual mode of transmission: Secondary | ICD-10-CM | POA: Diagnosis not present

## 2022-04-17 DIAGNOSIS — Z3201 Encounter for pregnancy test, result positive: Secondary | ICD-10-CM | POA: Diagnosis not present

## 2022-04-17 DIAGNOSIS — N760 Acute vaginitis: Secondary | ICD-10-CM | POA: Diagnosis not present

## 2022-04-17 DIAGNOSIS — I10 Essential (primary) hypertension: Secondary | ICD-10-CM | POA: Diagnosis not present

## 2022-04-19 DIAGNOSIS — Z3201 Encounter for pregnancy test, result positive: Secondary | ICD-10-CM | POA: Diagnosis not present

## 2022-04-24 DIAGNOSIS — Z3201 Encounter for pregnancy test, result positive: Secondary | ICD-10-CM | POA: Diagnosis not present

## 2022-05-07 DIAGNOSIS — Z3A01 Less than 8 weeks gestation of pregnancy: Secondary | ICD-10-CM | POA: Diagnosis not present

## 2022-05-07 DIAGNOSIS — O30039 Twin pregnancy, monochorionic/diamniotic, unspecified trimester: Secondary | ICD-10-CM | POA: Diagnosis not present

## 2022-05-18 ENCOUNTER — Telehealth: Payer: BC Managed Care – PPO | Admitting: Physician Assistant

## 2022-05-18 DIAGNOSIS — N898 Other specified noninflammatory disorders of vagina: Secondary | ICD-10-CM

## 2022-05-18 NOTE — Progress Notes (Signed)
For the safety of you and your child, I recommend a face to face office visit with a health care provider.  Many mothers need to take medicines during their pregnancy and while nursing.  Almost all medicines pass into the breast milk in small quantities.  Most are generally considered safe for a mother to take but some medicines must be avoided.  After reviewing your E-Visit request, I recommend that you consult your OB/GYN or pediatrician for medical advice in relation to your condition and prescription medications while pregnant or breastfeeding.  NOTE:  There will be NO CHARGE for this eVisit  If you are having a true medical emergency please call 911.    For an urgent face to face visit, [facility] has six urgent care centers for your convenience:     [facility] at [facility] Directions [phone] [address] [place], [place] [address]    [facility] Health Urgent Care Center [facility]) Get Driving Directions [phone] [address] [place], [place] [address]  [facility] Health Urgent Care Center [facility]) Get Driving Directions [phone] [address] [place],  [place]  [address]  [facility] Health Urgent Care at [facility] Get Driving Directions [phone] [address], [address] [place], [place] [address]   [facility] Health Urgent Care at [facility] Get Driving Directions  [phone] [address] [place], [place] [address]   [facility] Health Urgent Care at [facility] Directions [phone] [address], [address] [place], [place] [address]  Your MyChart E-visit questionnaire answers were reviewed by a board certified advanced clinical practitioner to complete your personal care plan based on your specific symptoms.  Thank you for using e-Visits.  Jarold Motto PA-C

## 2022-05-24 DIAGNOSIS — Z3689 Encounter for other specified antenatal screening: Secondary | ICD-10-CM | POA: Diagnosis not present

## 2022-05-24 DIAGNOSIS — Z3A09 9 weeks gestation of pregnancy: Secondary | ICD-10-CM | POA: Diagnosis not present

## 2022-05-24 DIAGNOSIS — Z113 Encounter for screening for infections with a predominantly sexual mode of transmission: Secondary | ICD-10-CM | POA: Diagnosis not present

## 2022-05-24 DIAGNOSIS — Z363 Encounter for antenatal screening for malformations: Secondary | ICD-10-CM | POA: Diagnosis not present

## 2022-05-24 DIAGNOSIS — O26891 Other specified pregnancy related conditions, first trimester: Secondary | ICD-10-CM | POA: Diagnosis not present

## 2022-05-24 DIAGNOSIS — O10019 Pre-existing essential hypertension complicating pregnancy, unspecified trimester: Secondary | ICD-10-CM | POA: Diagnosis not present

## 2022-05-24 DIAGNOSIS — O30041 Twin pregnancy, dichorionic/diamniotic, first trimester: Secondary | ICD-10-CM | POA: Diagnosis not present

## 2022-05-31 ENCOUNTER — Encounter: Payer: Self-pay | Admitting: Cardiology

## 2022-05-31 ENCOUNTER — Ambulatory Visit (INDEPENDENT_AMBULATORY_CARE_PROVIDER_SITE_OTHER): Payer: BC Managed Care – PPO | Admitting: Cardiology

## 2022-05-31 VITALS — BP 132/80 | HR 86 | Ht 64.0 in | Wt 220.5 lb

## 2022-05-31 DIAGNOSIS — O10919 Unspecified pre-existing hypertension complicating pregnancy, unspecified trimester: Secondary | ICD-10-CM

## 2022-05-31 DIAGNOSIS — O99210 Obesity complicating pregnancy, unspecified trimester: Secondary | ICD-10-CM

## 2022-05-31 DIAGNOSIS — O0991 Supervision of high risk pregnancy, unspecified, first trimester: Secondary | ICD-10-CM

## 2022-05-31 MED ORDER — NIFEDIPINE ER 30 MG PO TB24: 30.0000 mg | ORAL_TABLET | Freq: Every day | ORAL | 2 refills | Status: DC

## 2022-05-31 NOTE — Patient Instructions (Signed)
Medication Instructions:  Your physician has recommended you make the following change in your medication:  START: Nifedipine 30mg  daily  *If you need a refill on your cardiac medications before your next appointment, please call your pharmacy*   Lab Work: NONE If you have labs (blood work) drawn today and your tests are completely normal, you will receive your results only by: MyChart Message (if you have MyChart) OR A paper copy in the mail If you have any lab test that is abnormal or we need to change your treatment, we will call you to review the results.   Testing/Procedures: NONE   Follow-Up: At [facility], you and your health needs are our priority.  As part of our continuing mission to provide you with exceptional heart care, we have created designated Provider Care Teams.  These Care Teams include your primary Cardiologist (physician) and Advanced Practice Providers (APPs -  Physician Assistants and Nurse Practitioners) who all work together to provide you with the care you need, when you need it.  We recommend signing up for the patient portal called "MyChart".  Sign up information is provided on this After Visit Summary.  MyChart is used to connect with patients for Virtual Visits (Telemedicine).  Patients are able to view lab/test results, encounter notes, upcoming appointments, etc.  Non-urgent messages can be sent to your provider as well.   To learn more about what you can do with MyChart, go to [facility].    Your next appointment:   12 week(s)  The format for your next appointment:   Virtual Visit   Provider:   [email or web address], DO

## 2022-05-31 NOTE — Progress Notes (Unsigned)
Cardio-Obstetrics Clinic  Follow Up Note   Date:  06/04/2022   ID:  ZYA FINKLE, DOB 1986/07/26, MRN [id number]  PCP:  Irena Reichmann, DO   [facility] Providers Cardiologist:  Thomasene Ripple, DO  Electrophysiologist:  None        Referring MD: Irena Reichmann, DO   Chief Complaint: " I am ok"  History of Present Illness:    [name] is a 36 y.o. female [G5P1031] who returns for follow up of    Since I saw the patient she has had sequence of events. She lost the last pregnancy through a miscarriage. But since she has conceived again and is now [redacted] weeks pregnant with twins.   She has also transferred her care to another OB group.  She offers no complaints today.   Prior CV Studies Reviewed: The following studies were reviewed today:   Past Medical History:  Diagnosis Date   Anxiety    H/O seasonal allergies    Hypertension    Migraine     Past Surgical History:  Procedure Laterality Date   CESAREAN SECTION N/A 04/30/2013   Procedure: CESAREAN SECTION;  Surgeon: Kirkland Hun, MD;  Location: [facility];  Service: Obstetrics;  Laterality: N/A;      OB History     Gravida  5   Para  1   Term  1   Preterm      AB  3   Living  1      SAB  2   IAB  1   Ectopic      Multiple      Live Births  1               Current Medications: Current Meds  Medication Sig   NIFEdipine (ADALAT CC) 30 MG 24 hr tablet Take 1 tablet (30 mg total) by mouth daily.   [DISCONTINUED] NIFEdipine (PROCARDIA-XL/NIFEDICAL-XL) 30 MG 24 hr tablet Take 1 tablet (30 mg total) by mouth daily.     Allergies:   Bactrim [sulfamethoxazole-trimethoprim] and Topiramate   Social History   Socioeconomic History   Marital status: Single    Spouse name: Not on file   Number of children: 1   Years of education: Not on file   Highest education level: Not on file  Occupational History   Occupation: human resources  Tobacco Use   Smoking status: Never    Smokeless tobacco: Never  Vaping Use   Vaping Use: Never used  Substance and Sexual Activity   Alcohol use: Yes    Comment: occ   Drug use: No   Sexual activity: Not Currently    Birth control/protection: None  Other Topics Concern   Not on file  Social History Narrative   Lives with her child   Right handed   Caffeine: 1-2 cups/day   Social Determinants of Health   Financial Resource Strain: Not on file  Food Insecurity: No Food Insecurity (02/01/2022)   Hunger Vital Sign    Worried About Running Out of Food in the Last Year: Never true    Ran Out of Food in the Last Year: Never true  Transportation Needs: No Transportation Needs (02/01/2022)   PRAPARE - Administrator, Civil Service (Medical): No    Lack of Transportation (Non-Medical): No  Physical Activity: Not on file  Stress: Not on file  Social Connections: Not on file      Family History  Problem Relation Age of Onset  Healthy Mother    Migraines Mother    Prostate cancer Father    Healthy Son    Colon cancer Paternal Grandmother    Migraines Maternal Grandmother    Heart disease Neg Hx    Hypertension Neg Hx       ROS:   Please see the history of present illness.     All other systems reviewed and are negative.   Labs/EKG Reviewed:    EKG:   EKG is was not ordered today.    Recent Labs: No results found for requested labs within last 365 days.   Recent Lipid Panel No results found for: "CHOL", "TRIG", "HDL", "CHOLHDL", "LDLCALC", "LDLDIRECT"  Physical Exam:    VS:  BP 132/80   Pulse 86   Ht 5\' 4"  (1.626 m)   Wt 220 lb 8 oz (100 kg)   LMP 12/21/2021   BMI 37.85 kg/m     Wt Readings from Last 3 Encounters:  05/31/22 220 lb 8 oz (100 kg)  01/31/22 217 lb 12.8 oz (98.8 kg)  05/05/21 230 lb (104.3 kg)     GEN:  Well nourished, well developed in no acute distress HEENT: Normal NECK: No JVD; No carotid bruits LYMPHATICS: No lymphadenopathy CARDIAC: RRR, no murmurs, rubs,  gallops RESPIRATORY:  Clear to auscultation without rales, wheezing or rhonchi  ABDOMEN: Soft, non-tender, non-distended MUSCULOSKELETAL:  No edema; No deformity  SKIN: Warm and dry NEUROLOGIC:  Alert and oriented x 3 PSYCHIATRIC:  Normal affect    Risk Assessment/Risk Calculators:     CARPREG II Risk Prediction Index Score:  1.  The patient's risk for a primary cardiac event is 5%.            ASSESSMENT & PLAN:    Chronic hypertension in pregnancy - she is doing well on her nifedipine. Will send refills. I ask the patient to take her blood pressure daily. Educated her if >140/90 mmHg to notify me immediately. She will start her Aspirin 81 mg daily for her preeclampsia prophylaxis.  Patient will limit weight gain between 11 to 20lbs.    Patient Instructions  Medication Instructions:  Your physician has recommended you make the following change in your medication:  START: Nifedipine 30mg  daily  *If you need a refill on your cardiac medications before your next appointment, please call your pharmacy*   Lab Work: NONE If you have labs (blood work) drawn today and your tests are completely normal, you will receive your results only by: MyChart Message (if you have MyChart) OR A paper copy in the mail If you have any lab test that is abnormal or we need to change your treatment, we will call you to review the results.   Testing/Procedures: NONE   Follow-Up: At [facility], you and your health needs are our priority.  As part of our continuing mission to provide you with exceptional heart care, we have created designated Provider Care Teams.  These Care Teams include your primary Cardiologist (physician) and Advanced Practice Providers (APPs -  Physician Assistants and Nurse Practitioners) who all work together to provide you with the care you need, when you need it.  We recommend signing up for the patient portal called "MyChart".  Sign up information is provided on  this After Visit Summary.  MyChart is used to connect with patients for Virtual Visits (Telemedicine).  Patients are able to view lab/test results, encounter notes, upcoming appointments, etc.  Non-urgent messages can be sent to your provider as  well.   To learn more about what you can do with MyChart, go to [email or web address].    Your next appointment:   12 week(s)  The format for your next appointment:   Virtual Visit   Provider:   Thomasene Ripple, DO    Dispo:  Return in about 12 weeks (around 08/23/2022).   Medication Adjustments/Labs and Tests Ordered: Current medicines are reviewed at length with the patient today.  Concerns regarding medicines are outlined above.  Tests Ordered: No orders of the defined types were placed in this encounter.  Medication Changes: Meds ordered this encounter  Medications   NIFEdipine (ADALAT CC) 30 MG 24 hr tablet    Sig: Take 1 tablet (30 mg total) by mouth daily.    Dispense:  90 tablet    Refill:  2

## 2022-06-03 ENCOUNTER — Other Ambulatory Visit: Payer: Self-pay | Admitting: Obstetrics and Gynecology

## 2022-06-03 DIAGNOSIS — Z363 Encounter for antenatal screening for malformations: Secondary | ICD-10-CM

## 2022-06-04 ENCOUNTER — Ambulatory Visit: Payer: BC Managed Care – PPO

## 2022-06-04 ENCOUNTER — Ambulatory Visit: Payer: BC Managed Care – PPO | Admitting: *Deleted

## 2022-06-04 ENCOUNTER — Other Ambulatory Visit: Payer: Self-pay | Admitting: Obstetrics and Gynecology

## 2022-06-04 ENCOUNTER — Other Ambulatory Visit: Payer: Self-pay | Admitting: *Deleted

## 2022-06-04 ENCOUNTER — Encounter: Payer: Self-pay | Admitting: *Deleted

## 2022-06-04 ENCOUNTER — Ambulatory Visit (HOSPITAL_BASED_OUTPATIENT_CLINIC_OR_DEPARTMENT_OTHER): Payer: BC Managed Care – PPO | Admitting: Obstetrics

## 2022-06-04 ENCOUNTER — Ambulatory Visit: Payer: BC Managed Care – PPO | Attending: Obstetrics and Gynecology

## 2022-06-04 VITALS — BP 119/80 | HR 86

## 2022-06-04 DIAGNOSIS — O30031 Twin pregnancy, monochorionic/diamniotic, first trimester: Secondary | ICD-10-CM

## 2022-06-04 DIAGNOSIS — Z3401 Encounter for supervision of normal first pregnancy, first trimester: Secondary | ICD-10-CM | POA: Diagnosis not present

## 2022-06-04 DIAGNOSIS — Z363 Encounter for antenatal screening for malformations: Secondary | ICD-10-CM

## 2022-06-04 DIAGNOSIS — Z6838 Body mass index (BMI) 38.0-38.9, adult: Secondary | ICD-10-CM

## 2022-06-04 DIAGNOSIS — O30039 Twin pregnancy, monochorionic/diamniotic, unspecified trimester: Secondary | ICD-10-CM

## 2022-06-04 DIAGNOSIS — O10911 Unspecified pre-existing hypertension complicating pregnancy, first trimester: Secondary | ICD-10-CM | POA: Insufficient documentation

## 2022-06-04 DIAGNOSIS — O34219 Maternal care for unspecified type scar from previous cesarean delivery: Secondary | ICD-10-CM | POA: Diagnosis not present

## 2022-06-04 DIAGNOSIS — O30001 Twin pregnancy, unspecified number of placenta and unspecified number of amniotic sacs, first trimester: Secondary | ICD-10-CM | POA: Diagnosis not present

## 2022-06-04 DIAGNOSIS — Z3A11 11 weeks gestation of pregnancy: Secondary | ICD-10-CM

## 2022-06-04 DIAGNOSIS — O99213 Obesity complicating pregnancy, third trimester: Secondary | ICD-10-CM | POA: Insufficient documentation

## 2022-06-04 DIAGNOSIS — O10919 Unspecified pre-existing hypertension complicating pregnancy, unspecified trimester: Secondary | ICD-10-CM

## 2022-06-04 DIAGNOSIS — O358XX2 Maternal care for other (suspected) fetal abnormality and damage, fetus 2: Secondary | ICD-10-CM | POA: Diagnosis not present

## 2022-06-04 DIAGNOSIS — O358XX1 Maternal care for other (suspected) fetal abnormality and damage, fetus 1: Secondary | ICD-10-CM

## 2022-06-04 DIAGNOSIS — O10011 Pre-existing essential hypertension complicating pregnancy, first trimester: Secondary | ICD-10-CM | POA: Diagnosis not present

## 2022-06-04 DIAGNOSIS — O99211 Obesity complicating pregnancy, first trimester: Secondary | ICD-10-CM

## 2022-06-04 DIAGNOSIS — E669 Obesity, unspecified: Secondary | ICD-10-CM

## 2022-06-04 NOTE — Progress Notes (Signed)
MFM Note  [name] was seen due to a spontaneously conceived twin pregnancy.  She has a history of chronic hypertension that is treated with Procardia 30 mg daily.  She denies any problems in her current pregnancy.  She has not had any screening tests for fetal aneuploidy drawn in her current pregnancy.  A thin dividing membrane was noted separating the two fetuses along with a single placenta, indicating that these are monochorionic, diamniotic twins.  The crown-rump lengths for both twin A and twin B are consistent with an EDC of December 23, 2022.  The following were discussed during today's consultation:  Monochorionic, diamniotic twin gestation  The implications and management of monochorionic twins was discussed.   The 10% to 15% risk of twin to twin transfusion syndrome seen in monochorionic, diamniotic twins was discussed.   The implications and management of twin to twin transfusion syndrome (TTTS) should she develop this complication was also discussed.  She was advised that we will continue to follow her closely with serial ultrasounds to assess for signs of TTTS.  She was advised that management of twin pregnancies will involve frequent ultrasound exams to assess the fetal growth and amniotic fluid level.    We will continue to follow her with ultrasound exams every 2 weeks to assess for signs of TTTS.   Weekly fetal testing should be started at around 32 weeks.    Delivery for uncomplicated monochorionic twins is recommended at around 37 weeks.  The increased risk of preeclampsia, gestational diabetes, and preterm birth/labor associated with twin pregnancies was discussed.    As pregnancies with multiple gestations are at increased risk for developing preeclampsia, she was advised to start taking up to 2 tablets of baby aspirin (81 mg) daily to decrease her risk of developing preeclampsia as soon as possible.  The patient had a Panorama cell free DNA test drawn in  our office today to confirm the zygosity of the twin gestation and to screen for Down syndrome.  Our genetic counselor will notify the patient regarding the results of this test.  Chronic hypertension in pregnancy  The patient was advised to continue taking Procardia to maintain her blood pressures at 140/90 or less.    The dosage of her Procardia may need to be increased later in her pregnancy should her blood pressures be greater than 140/90.  The increased risk of superimposed preeclampsia associated with chronic hypertension in pregnancy was also discussed today.   She understands that delivery prior to 37 weeks may be necessary should she develop preeclampsia later in her pregnancy.  A follow-up exam was scheduled in 4 weeks to screen for TTTS.    The patient stated that all of her questions were answered today.  A total of 45 minutes was spent counseling and coordinating the care for this patient.  Greater than 50% of the time was spent in direct face-to-face contact.

## 2022-06-11 ENCOUNTER — Other Ambulatory Visit: Payer: Self-pay

## 2022-06-11 DIAGNOSIS — O30039 Twin pregnancy, monochorionic/diamniotic, unspecified trimester: Secondary | ICD-10-CM

## 2022-06-21 ENCOUNTER — Other Ambulatory Visit: Payer: Self-pay

## 2022-06-21 ENCOUNTER — Encounter: Payer: Self-pay | Admitting: Cardiology

## 2022-06-21 MED ORDER — NIFEDIPINE ER OSMOTIC RELEASE 60 MG PO TB24: 60.0000 mg | ORAL_TABLET | Freq: Every day | ORAL | 3 refills | Status: DC

## 2022-07-05 ENCOUNTER — Ambulatory Visit: Payer: BC Managed Care – PPO | Attending: Obstetrics

## 2022-07-05 ENCOUNTER — Encounter (HOSPITAL_COMMUNITY): Payer: Self-pay | Admitting: Obstetrics and Gynecology

## 2022-07-05 ENCOUNTER — Ambulatory Visit: Payer: BC Managed Care – PPO | Admitting: *Deleted

## 2022-07-05 ENCOUNTER — Other Ambulatory Visit: Payer: Self-pay | Admitting: Obstetrics

## 2022-07-05 ENCOUNTER — Ambulatory Visit: Payer: BC Managed Care – PPO | Attending: Obstetrics | Admitting: Obstetrics

## 2022-07-05 ENCOUNTER — Other Ambulatory Visit: Payer: Self-pay

## 2022-07-05 VITALS — BP 125/77 | HR 85

## 2022-07-05 DIAGNOSIS — Z3A15 15 weeks gestation of pregnancy: Secondary | ICD-10-CM

## 2022-07-05 DIAGNOSIS — O10912 Unspecified pre-existing hypertension complicating pregnancy, second trimester: Secondary | ICD-10-CM

## 2022-07-05 DIAGNOSIS — O10919 Unspecified pre-existing hypertension complicating pregnancy, unspecified trimester: Secondary | ICD-10-CM

## 2022-07-05 DIAGNOSIS — O021 Missed abortion: Secondary | ICD-10-CM

## 2022-07-05 DIAGNOSIS — O10012 Pre-existing essential hypertension complicating pregnancy, second trimester: Secondary | ICD-10-CM | POA: Diagnosis not present

## 2022-07-05 DIAGNOSIS — O34219 Maternal care for unspecified type scar from previous cesarean delivery: Secondary | ICD-10-CM | POA: Diagnosis not present

## 2022-07-05 DIAGNOSIS — O30039 Twin pregnancy, monochorionic/diamniotic, unspecified trimester: Secondary | ICD-10-CM | POA: Diagnosis not present

## 2022-07-05 DIAGNOSIS — O30032 Twin pregnancy, monochorionic/diamniotic, second trimester: Secondary | ICD-10-CM | POA: Diagnosis not present

## 2022-07-05 DIAGNOSIS — Z6838 Body mass index (BMI) 38.0-38.9, adult: Secondary | ICD-10-CM | POA: Diagnosis not present

## 2022-07-05 DIAGNOSIS — E669 Obesity, unspecified: Secondary | ICD-10-CM

## 2022-07-05 DIAGNOSIS — O364XX2 Maternal care for intrauterine death, fetus 2: Secondary | ICD-10-CM | POA: Diagnosis not present

## 2022-07-05 DIAGNOSIS — O99212 Obesity complicating pregnancy, second trimester: Secondary | ICD-10-CM

## 2022-07-05 DIAGNOSIS — O364XX1 Maternal care for intrauterine death, fetus 1: Secondary | ICD-10-CM

## 2022-07-05 NOTE — Progress Notes (Signed)
MFM Note  [name] was seen due to a spontaneously conceived monochorionic, diamniotic twin gestation.  Her pregnancy has also been complicated by chronic hypertension that is treated with Procardia.  She had a cell free DNA test drawn earlier in her pregnancy indicating a low risk for trisomy 97, 64, and 13.  Two female fetuses are predicted.  These are predicted to be monozygotic or identical twins.  On today's exam, a fetal demise of both fetuses are noted.    The crown-rump lengths for both twin A and twin B measuring at 12+ weeks, indicating that the miscarriage most likely occurred about 2 or 3 weeks ago.  The patient reports that she also had a first trimester miscarriage earlier this year.  She was reassured that there was nothing that she nor anyone else did that caused the demise.  She was also reassured that she will most likely have a successful pregnancy outcome during her future pregnancies.   Management options for a fetal demise including a D&E versus an induction of labor were discussed.  The risks versus benefits of each procedure was discussed.    The patient stated that she would prefer to have a D&E procedure performed as soon as possible.  I discussed her case with Dr. Reina Fuse.  She will schedule a D&E procedure for the patient sometime this weekend.    Her cervix should be prepped for the procedure either through the use of laminaria or Cytotec.  The products of conception should be sent for the [facility] miscarriage test available through the lab [facility] or the Reveal miscarriage test that is available through [facility].  These MicroArray tests may provide more information regarding genetic abnormalities in the fetus that may have contributed to the fetal demise.  As this is the second miscarriage that she has had this year, she should have her blood drawn for antiphospholipid antibodies (IgM and IgG for anti-beta 2 glycoprotein-1, IgM and IgG for anticardiolipin  antibodies, and lupus anticoagulant) a few weeks after delivery.    Should she screen positive for the antiphospholipid antibodies, the patient was advised that she may be started on Lovenox and a daily baby aspirin to help improve her pregnancy outcome in the future.  The patient stated that all of her questions have been answered today.  A total of 20 minutes was spent counseling and coordinating the care for this patient.  Greater than 50% of the time was spent in direct face-to-face contact.

## 2022-07-05 NOTE — Progress Notes (Signed)
Spoke with pt for pre-op call. Pt denies cardiac history or Diabetes. Pt is treated for HTN.   Shower instructions given to pt and she voiced understanding.

## 2022-07-06 ENCOUNTER — Other Ambulatory Visit: Payer: Self-pay

## 2022-07-06 ENCOUNTER — Ambulatory Visit (HOSPITAL_COMMUNITY)
Admission: RE | Admit: 2022-07-06 | Discharge: 2022-07-06 | Disposition: A | Discharge status: Home / Self Care | Payer: BC Managed Care – PPO | Attending: Obstetrics and Gynecology | Admitting: Obstetrics and Gynecology

## 2022-07-06 ENCOUNTER — Ambulatory Visit (HOSPITAL_COMMUNITY): Payer: BC Managed Care – PPO | Admitting: Anesthesiology

## 2022-07-06 ENCOUNTER — Ambulatory Visit (HOSPITAL_COMMUNITY): Payer: BC Managed Care – PPO

## 2022-07-06 ENCOUNTER — Encounter (HOSPITAL_COMMUNITY): Payer: Self-pay | Admitting: Obstetrics and Gynecology

## 2022-07-06 ENCOUNTER — Encounter (HOSPITAL_COMMUNITY)
Admission: RE | Disposition: A | Discharge status: Home / Self Care | Payer: Self-pay | Source: Home / Self Care | Attending: Obstetrics and Gynecology

## 2022-07-06 DIAGNOSIS — X58XXXA Exposure to other specified factors, initial encounter: Secondary | ICD-10-CM | POA: Diagnosis not present

## 2022-07-06 DIAGNOSIS — I10 Essential (primary) hypertension: Secondary | ICD-10-CM | POA: Diagnosis not present

## 2022-07-06 DIAGNOSIS — S30814A Abrasion of vagina and vulva, initial encounter: Secondary | ICD-10-CM | POA: Insufficient documentation

## 2022-07-06 DIAGNOSIS — R059 Cough, unspecified: Secondary | ICD-10-CM | POA: Diagnosis not present

## 2022-07-06 DIAGNOSIS — R0602 Shortness of breath: Secondary | ICD-10-CM | POA: Diagnosis not present

## 2022-07-06 DIAGNOSIS — O021 Missed abortion: Secondary | ICD-10-CM | POA: Insufficient documentation

## 2022-07-06 DIAGNOSIS — S3141XA Laceration without foreign body of vagina and vulva, initial encounter: Secondary | ICD-10-CM | POA: Diagnosis not present

## 2022-07-06 HISTORY — PX: DILATION AND EVACUATION: SHX1459

## 2022-07-06 LAB — BASIC METABOLIC PANEL
Anion gap: 5 (ref 5–15)
BUN: <5 mg/dL — ABNORMAL LOW (ref 6–20)
CO2: 23 mmol/L (ref 22–32)
Calcium: 9 mg/dL (ref 8.9–10.3)
Chloride: 108 mmol/L (ref 98–111)
Creatinine, Ser: 0.69 mg/dL (ref 0.44–1.00)
GFR, Estimated: >60 mL/min (ref >60)
Glucose, Bld: 77 mg/dL (ref 70–99)
Potassium: 3.7 mmol/L (ref 3.5–5.1)
Sodium: 136 mmol/L (ref 135–145)

## 2022-07-06 LAB — CBC
HCT: 34.9 % — ABNORMAL LOW (ref 36.0–46.0)
Hemoglobin: 11.6 g/dL — ABNORMAL LOW (ref 12.0–15.0)
MCH: 29.3 pg (ref 26.0–34.0)
MCHC: 33.2 g/dL (ref 30.0–36.0)
MCV: 88.1 fL (ref 80.0–100.0)
Platelets: 245 10*3/uL (ref 150–400)
RBC: 3.96 MIL/uL (ref 3.87–5.11)
RDW: 13.9 % (ref 11.5–15.5)
WBC: 8.4 10*3/uL (ref 4.0–10.5)
nRBC: 0 % (ref 0.0–0.2)

## 2022-07-06 LAB — TYPE AND SCREEN
ABO/RH(D): O POS
Antibody Screen: NEGATIVE

## 2022-07-06 SURGERY — DILATION AND EVACUATION, UTERUS
Anesthesia: General

## 2022-07-06 MED ORDER — MEPERIDINE HCL 25 MG/ML IJ SOLN: 6.2500 mg | INTRAMUSCULAR | Status: DC | PRN

## 2022-07-06 MED ORDER — ONDANSETRON HCL 4 MG/2ML IJ SOLN
INTRAMUSCULAR | Status: AC
Start: 2022-07-06 — End: ?
  Filled 2022-07-06: qty 2

## 2022-07-06 MED ORDER — CHLORHEXIDINE GLUCONATE 0.12 % MT SOLN: 15.0000 mL | Freq: Once | OROMUCOSAL | Status: DC

## 2022-07-06 MED ORDER — METHYLERGONOVINE MALEATE 0.2 MG/ML IJ SOLN
INTRAMUSCULAR | Status: AC
  Filled 2022-07-06: qty 1

## 2022-07-06 MED ORDER — ONDANSETRON HCL 4 MG/2ML IJ SOLN
INTRAMUSCULAR | Status: DC | PRN
  Administered 2022-07-06: 4 mg via INTRAVENOUS

## 2022-07-06 MED ORDER — PROPOFOL 10 MG/ML IV BOLUS
INTRAVENOUS | Status: DC | PRN
  Administered 2022-07-06: 200 mg via INTRAVENOUS

## 2022-07-06 MED ORDER — FENTANYL CITRATE (PF) 250 MCG/5ML IJ SOLN
INTRAMUSCULAR | Status: AC
  Filled 2022-07-06: qty 5

## 2022-07-06 MED ORDER — MISOPROSTOL 200 MCG PO TABS
ORAL_TABLET | ORAL | Status: AC
  Filled 2022-07-06: qty 4

## 2022-07-06 MED ORDER — MIDAZOLAM HCL 2 MG/2ML IJ SOLN
INTRAMUSCULAR | Status: DC | PRN
  Administered 2022-07-06: 2 mg via INTRAVENOUS

## 2022-07-06 MED ORDER — IBUPROFEN 800 MG PO TABS: 800.0000 mg | ORAL_TABLET | Freq: Three times a day (TID) | ORAL | 0 refills | Status: DC

## 2022-07-06 MED ORDER — OXYCODONE HCL 5 MG PO TABS: 5.0000 mg | ORAL_TABLET | Freq: Once | ORAL | Status: DC | PRN

## 2022-07-06 MED ORDER — PHENYLEPHRINE 80 MCG/ML (10ML) SYRINGE FOR IV PUSH (FOR BLOOD PRESSURE SUPPORT)
PREFILLED_SYRINGE | INTRAVENOUS | Status: DC | PRN
  Administered 2022-07-06 (×2): 80 ug via INTRAVENOUS

## 2022-07-06 MED ORDER — FENTANYL CITRATE (PF) 250 MCG/5ML IJ SOLN
INTRAMUSCULAR | Status: DC | PRN
  Administered 2022-07-06: 100 ug via INTRAVENOUS
  Administered 2022-07-06: 25 ug via INTRAVENOUS
  Administered 2022-07-06: 50 ug via INTRAVENOUS

## 2022-07-06 MED ORDER — OXYCODONE HCL 5 MG/5ML PO SOLN: 5.0000 mg | Freq: Once | ORAL | Status: DC | PRN

## 2022-07-06 MED ORDER — ORAL CARE MOUTH RINSE: 15.0000 mL | Freq: Once | OROMUCOSAL | Status: AC

## 2022-07-06 MED ORDER — METHYLERGONOVINE MALEATE 0.2 MG/ML IJ SOLN
INTRAMUSCULAR | Status: DC | PRN
  Administered 2022-07-06: .2 mg via INTRAMUSCULAR

## 2022-07-06 MED ORDER — ACETAMINOPHEN 500 MG PO TABS
1000.0000 mg | ORAL_TABLET | ORAL | Status: AC
  Administered 2022-07-06: 1000 mg via ORAL
  Filled 2022-07-06: qty 2

## 2022-07-06 MED ORDER — POVIDONE-IODINE 10 % EX SWAB
2.0000 | Freq: Once | CUTANEOUS | Status: AC
  Administered 2022-07-06: 2 via TOPICAL

## 2022-07-06 MED ORDER — DEXTROSE 5 % IV SOLN
200.0000 mg | Freq: Once | INTRAVENOUS | Status: AC
  Administered 2022-07-06: 200 mg via INTRAVENOUS
  Filled 2022-07-06: qty 200

## 2022-07-06 MED ORDER — LIDOCAINE HCL (CARDIAC) PF 100 MG/5ML IV SOSY
PREFILLED_SYRINGE | INTRAVENOUS | Status: DC | PRN
  Administered 2022-07-06: 100 mg via INTRATRACHEAL

## 2022-07-06 MED ORDER — CHLORHEXIDINE GLUCONATE 0.12 % MT SOLN
15.0000 mL | Freq: Once | OROMUCOSAL | Status: AC
  Administered 2022-07-06: 15 mL via OROMUCOSAL
  Filled 2022-07-06: qty 15

## 2022-07-06 MED ORDER — FENTANYL CITRATE (PF) 100 MCG/2ML IJ SOLN: 25.0000 ug | INTRAMUSCULAR | Status: DC | PRN

## 2022-07-06 MED ORDER — LACTATED RINGERS IV SOLN: INTRAVENOUS | Status: DC

## 2022-07-06 MED ORDER — PROMETHAZINE HCL 25 MG/ML IJ SOLN: 6.2500 mg | INTRAMUSCULAR | Status: DC | PRN

## 2022-07-06 MED ORDER — DEXAMETHASONE SODIUM PHOSPHATE 10 MG/ML IJ SOLN
INTRAMUSCULAR | Status: DC | PRN
  Administered 2022-07-06: 8 mg via INTRAVENOUS

## 2022-07-06 MED ORDER — DIPHENHYDRAMINE HCL 50 MG/ML IJ SOLN
INTRAMUSCULAR | Status: DC | PRN
  Administered 2022-07-06: 12.5 mg via INTRAVENOUS

## 2022-07-06 MED ORDER — LIDOCAINE HCL (PF) 1 % IJ SOLN
INTRAMUSCULAR | Status: AC
Start: 2022-07-06 — End: ?
  Filled 2022-07-06: qty 30

## 2022-07-06 MED ORDER — MISOPROSTOL 100 MCG PO TABS
ORAL_TABLET | ORAL | Status: DC | PRN
  Administered 2022-07-06: 800 ug via RECTAL

## 2022-07-06 MED ORDER — 0.9 % SODIUM CHLORIDE (POUR BTL) OPTIME
TOPICAL | Status: DC | PRN
  Administered 2022-07-06: 1000 mL

## 2022-07-06 MED ORDER — ORAL CARE MOUTH RINSE: 15.0000 mL | Freq: Once | OROMUCOSAL | Status: DC

## 2022-07-06 MED ORDER — MIDAZOLAM HCL 2 MG/2ML IJ SOLN
INTRAMUSCULAR | Status: AC
  Filled 2022-07-06: qty 2

## 2022-07-06 MED ORDER — LIDOCAINE HCL 1 % IJ SOLN
INTRAMUSCULAR | Status: DC | PRN
  Administered 2022-07-06: 5 mL

## 2022-07-06 SURGICAL SUPPLY — 26 items
CATH ROBINSON RED A/P 16FR (CATHETERS) ×1 IMPLANT
FILTER UTR ASPR ASSEMBLY (MISCELLANEOUS) ×1 IMPLANT
GLOVE BIO SURGEON STRL SZ 6 (GLOVE) ×1 IMPLANT
GLOVE BIOGEL PI IND STRL 6.5 (GLOVE) ×1 IMPLANT
GLOVE BIOGEL PI IND STRL 7.0 (GLOVE) ×1 IMPLANT
GLOVE BIOGEL PI INDICATOR 6.5 (GLOVE) ×1
GLOVE BIOGEL PI INDICATOR 7.0 (GLOVE) ×1
GOWN STRL REUS W/ TWL LRG LVL3 (GOWN DISPOSABLE) ×2 IMPLANT
GOWN STRL REUS W/TWL LRG LVL3 (GOWN DISPOSABLE) ×2
HOSE CONNECTING 18IN BERKELEY (TUBING) ×1 IMPLANT
KIT BERKELEY 1ST TRI 3/8 NO TR (MISCELLANEOUS) ×1 IMPLANT
KIT BERKELEY 1ST TRIMESTER 3/8 (MISCELLANEOUS) ×1 IMPLANT
NS IRRIG 1000ML POUR BTL (IV SOLUTION) ×1 IMPLANT
PACK VAGINAL MINOR WOMEN LF (CUSTOM PROCEDURE TRAY) ×1 IMPLANT
PAD OB MATERNITY 4.3X12.25 (PERSONAL CARE ITEMS) ×1 IMPLANT
SET BERKELEY SUCTION TUBING (SUCTIONS) ×1 IMPLANT
SPIKE FLUID TRANSFER (MISCELLANEOUS) ×1 IMPLANT
SUT VIC AB 3-0 SH 27 (SUTURE) ×1
SUT VIC AB 3-0 SH 27X BRD (SUTURE) IMPLANT
TOWEL GREEN STERILE FF (TOWEL DISPOSABLE) ×2 IMPLANT
UNDERPAD 30X36 HEAVY ABSORB (UNDERPADS AND DIAPERS) ×1 IMPLANT
VACURETTE 10 RIGID CVD (CANNULA) IMPLANT
VACURETTE 12 RIGID CVD (CANNULA) IMPLANT
VACURETTE 7MM CVD STRL WRAP (CANNULA) IMPLANT
VACURETTE 8 RIGID CVD (CANNULA) IMPLANT
VACURETTE 9 RIGID CVD (CANNULA) IMPLANT

## 2022-07-06 NOTE — Transfer of Care (Signed)
Immediate Anesthesia Transfer of Care Note  Patient: [name]  Procedure(s) Performed: DILATATION AND EVACUATION WITH INTRAOPERATIVE ULTRASOUND, REPAIR OF LABIAL LACERATION  Patient Location: PACU  Anesthesia Type:General  Level of Consciousness: awake, alert  and oriented  Airway & Oxygen Therapy: Patient Spontanous Breathing and Patient connected to face mask oxygen  Post-op Assessment: Report given to RN and Post -op Vital signs reviewed and stable  Post vital signs: Reviewed and stable  Last Vitals:  Vitals Value Taken Time  BP 128/81 07/06/22 1503  Temp 36.4 C 07/06/22 1457  Pulse 101 07/06/22 1517  Resp 26 07/06/22 1517  SpO2 97 % 07/06/22 1517  Vitals shown include unvalidated device data.  Last Pain:  Vitals:   07/06/22 1457  TempSrc:   PainSc: 0-No pain      Patients Stated Pain Goal: 3 (07/06/22 1222)  Complications: No notable events documented.

## 2022-07-06 NOTE — Progress Notes (Signed)
MDA notified and wants Chest X Ray will continue to monitor.

## 2022-07-06 NOTE — Anesthesia Postprocedure Evaluation (Addendum)
Anesthesia Post Note  Patient: [name]  Procedure(s) Performed: DILATATION AND EVACUATION WITH INTRAOPERATIVE ULTRASOUND, REPAIR OF LABIAL LACERATION     Patient location during evaluation: PACU Anesthesia Type: General Level of consciousness: sedated and patient cooperative Pain management: pain level controlled Vital Signs Assessment: post-procedure vital signs reviewed and stable Respiratory status: spontaneous breathing Cardiovascular status: stable Anesthetic complications: yes (Inferior lip frenulum tear ) Comments: Inferior lip frenulum tear presumably during emergence when patient desaturated. I further examined the injury in PACU. It was ~3-4 cm wide and ~0.5cm deep at the lower labial frenulum. I recommended that she rinse with saline flushes after meals and monitor for signs of infection. I recommended that if it didn't heal quickly she should be seen by her oral surgeon. I also encouraged her to call with questions or concerns.    No notable events documented.  Last Vitals:  Vitals:   07/06/22 1545 07/06/22 1600  BP: (!) 118/90   Pulse: 93 97  Resp: (!) 24 17  Temp:  36.9 C  SpO2: 98% 97%    Last Pain:  Vitals:   07/06/22 1600  TempSrc:   PainSc: 0-No pain                 Lewie Loron

## 2022-07-06 NOTE — H&P (Signed)
VERITY OVERCASH is an 36 y.o. female presenting today for scheduled surgery. Denies VB, is starting to have menstrual-like crmping. Placed cytotec vaginally as instructed at approximately 0730  Pertinent Gynecological History: Blood transfusions: none Sexually transmitted diseases: no past history Previous GYN Procedures:  none   Last pap: normal Date: 01/14/22 OB History: G5, P1031; h/o csx x1   Menstrual History: Menarche age: 45 Patient's last menstrual period was 03/18/2022.    Past Medical History:  Diagnosis Date   COVID 10/2019   mild - lost taste and smell for 3 weeks   H/O seasonal allergies    Hypertension    Migraine    uses Botox    Past Surgical History:  Procedure Laterality Date   CESAREAN SECTION N/A 04/30/2013   Procedure: CESAREAN SECTION;  Surgeon: Ena Dawley, MD;  Location: [facility];  Service: Obstetrics;  Laterality: N/A;   COLONOSCOPY     UPPER GI ENDOSCOPY      Family History  Problem Relation Age of Onset   Healthy Mother    Migraines Mother    Prostate cancer Father    Healthy Son    Colon cancer Paternal Grandmother    Migraines Maternal Grandmother    Heart disease Neg Hx    Hypertension Neg Hx     Social History:  reports that she has never smoked. She has never used smokeless tobacco. She reports current alcohol use. She reports that she does not use drugs.  Allergies:  Allergies  Allergen Reactions   Bactrim [Sulfamethoxazole-Trimethoprim]     Chills, joint pain, sweating, headache   Topiramate Rash    Medications Prior to Admission  Medication Sig Dispense Refill Last Dose   DICLEGIS 10-10 MG TBEC Take 1 tablet by mouth daily.      NIFEdipine (PROCARDIA XL/NIFEDICAL XL) 60 MG 24 hr tablet Take 1 tablet (60 mg total) by mouth daily. 30 tablet 3    Prenatal Vit-Fe Fumarate-FA (PRENATAL VITAMINS PO) Take 1 tablet by mouth daily.       Review of Systems  Constitutional:  Negative for chills and fever.  Respiratory:  Negative  for shortness of breath.   Cardiovascular:  Negative for chest pain, palpitations and leg swelling.  Gastrointestinal:  Negative for abdominal pain, nausea and vomiting.  Neurological:  Negative for dizziness, weakness and headaches.  Psychiatric/Behavioral:  Negative for suicidal ideas.     Last menstrual period 03/18/2022, unknown if currently breastfeeding. Physical Exam Gen: NAD CV: CTAB, RRR Abd: soft, NTTP, Pfannenstiel, suprapubic fundus NTTP GU deferred MSK: neg for edema/TTP BL Psych/Neuro: WNL  No results found for this or any previous visit (from the past 24 hour(s)).  [place] MFM OB LIMITED  Result Date: 07/05/2022 ----------------------------------------------------------------------  OBSTETRICS REPORT                       (Signed Final 07/05/2022 11:42 am) ---------------------------------------------------------------------- Patient Info  ID #:       [id number]                          D.O.B.:  [birth year] (35 yrs)  Name:       Kennis Carina Woelfel                Visit Date: 07/05/2022 09:01 am ---------------------------------------------------------------------- Performed By  Attending:        Johnell Comings MD         Ref. Address:  [address]                                                             [address]                                                             [place]  Performed By:     Marye Round Pharisien     [facility]  Referred By:      Deliah Boston                    MD  ---------------------------------------------------------------------- Orders  #  Description                           Code        Ordered By  1  [place] MFM OB LIMITED  [id number]    Peterson Ao ----------------------------------------------------------------------  #  Order #                     Accession #                Episode #  1  [id number]                   [id number]                 [id number] ---------------------------------------------------------------------- Indications  Twin pregnancy, mono/di, second trimester      O30.032  Hypertension - Chronic/Pre-existing            O10.019  (Procardia)  Obesity complicating pregnancy, second         O99.212  trimester (BMI 38)  History of cesarean delivery, currently        O34.219  pregnant  [redacted] weeks gestation of pregnancy                Z3A.15 ---------------------------------------------------------------------- Vital Signs                            Pulse:  85  BP:          125/77 ---------------------------------------------------------------------- Fetal Evaluation (Fetus A)  Num Of Fetuses:         2  Cardiac Activity:       Not visualized  Membrane Desc:      Dividing Membrane seen - Monochorionic ---------------------------------------------------------------------- Biometry (Fetus A)  CRL:     64.02  mm     G. Age:  12w 4d                  EDD:   01/13/23 ---------------------------------------------------------------------- OB History  Gravidity:    5          SAB:   1  TOP:          2        Living:  1 ---------------------------------------------------------------------- Gestational Age (Fetus A)  LMP:           15w 4d        Date:  03/18/22                   EDD:   12/23/22  Best:          15w 4d     Det. By:  LMP  (03/18/22)          EDD:   12/23/22 ---------------------------------------------------------------------- Fetal Evaluation (Fetus B)  Num Of Fetuses:         2  Cardiac Activity:       Not visualized  Membrane Desc:      Dividing  Membrane seen - Monochorionic ---------------------------------------------------------------------- Biometry (Fetus B)  CRL:     64.89  mm     G. Age:  12w 5d                  EDD:   01/12/23 ---------------------------------------------------------------------- Gestational Age (Fetus B)  LMP:           15w 4d        Date:  03/18/22                   EDD:   12/23/22  Best:          15w 4d     Det. By:  LMP  (03/18/22)  EDD:   12/23/22 ---------------------------------------------------------------------- Comments  Angelica Chessman was seen due to a spontaneously conceived  monochorionic, diamniotic twin gestation.  Her pregnancy has  also been complicated by chronic hypertension that is treated  with Procardia.  She had a cell free DNA test drawn earlier in her pregnancy  indicating a low risk for trisomy 40, 46, and 13.  Two female  fetuses are predicted.  These are predicted to be  monozygotic or identical twins.  On today's exam, a fetal demise of both fetuses are noted.  The crown-rump lengths for both twin A and twin B measuring  at 12+ weeks, indicating that the miscarriage most likely  occurred about 2 or 3 weeks ago.  The patient reports that she also had a first trimester  miscarriage earlier this year.  She was reassured that there was nothing that she nor  anyone else did that caused the demise.  She was also  reassured that she will most likely have a successful  pregnancy outcome during her future pregnancies.  Management options for a fetal demise including a D&E  versus an induction of labor were discussed.  The risks  versus benefits of each procedure was discussed.  The patient stated that she would prefer to have a D&E  procedure performed as soon as possible.  I discussed her case with Dr. Reina Fuse.  She will schedule a  D&E procedure for the patient sometime this weekend.  Her cervix should be prepped for the procedure either  through the use of laminaria or Cytotec.  The products of conception  should be sent for the [facility]  miscarriage test available through the lab [facility] or the  Reveal miscarriage test that is available through [facility].  These MicroArray tests may provide more information  regarding genetic abnormalities in the fetus that may have  contributed to the fetal demise.  As this is the second miscarriage that she has had this year,  she should have her blood drawn for antiphospholipid  antibodies (IgM and IgG for anti-beta 2 glycoprotein-1, IgM  and IgG for anticardiolipin antibodies, and lupus  anticoagulant) a few weeks after delivery.  Should she screen positive for the antiphospholipid  antibodies, the patient was advised that she may be started  on Lovenox and a daily baby aspirin to help improve her  pregnancy outcome in the future.  The patient stated that all of her questions have been  answered today.  A total of 20 minutes was spent counseling and coordinating  the care for this patient.  Greater than 50% of the time was  spent in direct face-to-face contact. ----------------------------------------------------------------------                   Ma Rings, MD Electronically Signed Final Report   07/05/2022 11:42 am ----------------------------------------------------------------------   Assessment/Plan: This is a 35yo [id number] at 15 5/7 by LMP with missed AB of mono-di-TIUP at 12/27. Counseled on options as stated above for management, elected for surgical option. The patient was informed of the risks and benefits of a suction D&E. Risks included but were not limited to bleeidng, infections, injury to the vulva, vagina or cerivx, or uterine perforation. If concern for latter, may proceed with diagnostic laparoscopy for evaluation of injury which may require surgical repair. Patient understands and is amenable.   Of note, patient would like to begin contraceptive method. Currently on Nifedipine 60XL, well-controlled. Has been on COC's in past without issue/increase in BP. Will  drawn confirmatory beta  level outpatient on Friday 9/1 and, pending this, sending COC scripts with plans for phone f/u for BP review. Patient amenable to plan Of note, desires [facility] for [facility] today  Carlisle Cater 07/06/2022, 11:41 AM

## 2022-07-06 NOTE — Progress Notes (Signed)
Pt arrived to PACU with large laceration between teeth and lower lip area on the inside of her mouth.  CRNA and MD aware.  Bleeding controlled.  Will continue to monitor.

## 2022-07-06 NOTE — Anesthesia Preprocedure Evaluation (Signed)
Anesthesia Evaluation  Patient identified by MRN, date of birth, ID band Patient awake    Reviewed: Allergy & Precautions, NPO status , Patient's Chart, lab work & pertinent test results  Airway Mallampati: II  TM Distance: >3 FB Neck ROM: Full    Dental  (+) Dental Advisory Given, Teeth Intact   Pulmonary neg pulmonary ROS,    Pulmonary exam normal breath sounds clear to auscultation       Cardiovascular hypertension, Normal cardiovascular exam Rhythm:Regular Rate:Normal     Neuro/Psych  Headaches, PSYCHIATRIC DISORDERS Anxiety    GI/Hepatic Neg liver ROS, GERD  Controlled,  Endo/Other  negative endocrine ROS  Renal/GU negative Renal ROS     Musculoskeletal negative musculoskeletal ROS (+)   Abdominal (+) + obese,   Peds  Hematology negative hematology ROS (+)   Anesthesia Other Findings   Reproductive/Obstetrics negative OB ROS                            Anesthesia Physical Anesthesia Plan  ASA: 2  Anesthesia Plan: General   Post-op Pain Management: Tylenol PO (pre-op)*   Induction: Intravenous  PONV Risk Score and Plan: 4 or greater and Ondansetron, Dexamethasone, Treatment may vary due to age or medical condition and Midazolam  Airway Management Planned: LMA  Additional Equipment:   Intra-op Plan:   Post-operative Plan: Extubation in OR  Informed Consent: I have reviewed the patients History and Physical, chart, labs and discussed the procedure including the risks, benefits and alternatives for the proposed anesthesia with the patient or authorized representative who has indicated his/her understanding and acceptance.     Dental advisory given  Plan Discussed with: CRNA  Anesthesia Plan Comments:         Anesthesia Quick Evaluation

## 2022-07-06 NOTE — Anesthesia Procedure Notes (Signed)
Procedure Name: LMA Insertion Date/Time: 07/06/2022 1:32 PM  Performed by: Jodell Cipro, CRNAPre-anesthesia Checklist: Patient identified, Emergency Drugs available, Suction available and Patient being monitored Patient Re-evaluated:Patient Re-evaluated prior to induction Oxygen Delivery Method: Circle System Utilized Preoxygenation: Pre-oxygenation with 100% oxygen Induction Type: IV induction Ventilation: Mask ventilation without difficulty LMA: LMA inserted LMA Size: 4.0 Number of attempts: 1 Placement Confirmation: positive ETCO2 Tube secured with: Tape Dental Injury: Teeth and Oropharynx as per pre-operative assessment

## 2022-07-06 NOTE — Op Note (Signed)
07/06/22  Preoperative Diagnosis: mono-di-TIUP @ 15 5/7 with missed AB x2 at 12 2/7 Postoperative Diagnosis: Same as above Procedure: Suction dilation and evacuation; intraoperative ultrasound; repair of labial laceraiton Surgeon: Ellison Hughs, MD Anesthesia: LMA by Dr Renold Don IVF: 1200cc UOP: 25cc clear yellow urine EBL: 400cc   Findings: On bimanual exam, approx 19wk size uterus, mobile, anteverted: BL adenxa WNL. Uterus sounded to 20cm. Upon insertion of operative speculum, 1cm external os with dissolving cytotec tablet noted   Specimen: Curettings to pathology; additional sample sent to [facility] Complications: none   Patient is a 35yo [id number] @ 15 5/7 diagnosed with missed AB on 8/25 measuring 12/27 and 12 4/7 of mono-di-TIUP. Counseled regarding options and elected for surgical management in form of suction dilation and evacuation. Risks included but were not limited to bleeidng, infections, injury to the vulva, vagina or cerivx, or uterine perforation. If concern for latter, may proceed with diagnostic laparoscopy for evaluation of injury which may require surgical repair. Patient understands and is amenable.     Operative procedure: Pt was taken to operating room where LMA anesthesia was obtained without difficulty.  She was prepped and draped in the normal sterile fashion in the dorsal lithotomy position.  An appropriate timeout was performed.  A speculum was placed in the vagina and 2cc of 1% plain lidocaine injected into the anterior cervix.  An additional 9cc was placed both at 2 and 10 o'clock for a paracervical block. A single tooth tenaculum was placed on the anterior lip of the cervix. Cervix gently dilated past internal os using Pratt dilators (33). The 56mm suction currette was obtained and easily introduced.  With 8 passes, a significant amount of tissue was obtained.  When no further tissue was noted, the suction was discontinued and a gentle currettage performed. Intraoperative  ultrasound used to confirm scant POC remaining in left cornu which required 3 additional passes to clear; excellent tone around instrument noted at this time. The tenaculum was removed and silver nitrate used for hemostasis. Given TIUP and EGA, PR cytotec was placed rectally. In addition, IM methergine 0.2mg  was administered. After completion of portions, inspection of external genitalia noted right labial abrasion on inferior portion approx 1cm, slightly oozy; repaired with 4-0 vicryl, ice pack placed upon completion. All instruments and sponges were removed from vagina and the patient awakened and taken to the PACU in good condition.

## 2022-07-07 ENCOUNTER — Encounter (HOSPITAL_COMMUNITY): Payer: Self-pay | Admitting: Obstetrics and Gynecology

## 2022-07-09 LAB — SURGICAL PATHOLOGY

## 2022-07-10 DIAGNOSIS — G8918 Other acute postprocedural pain: Secondary | ICD-10-CM | POA: Diagnosis not present

## 2022-07-10 DIAGNOSIS — N857 Hematometra: Secondary | ICD-10-CM | POA: Diagnosis not present

## 2022-07-12 ENCOUNTER — Ambulatory Visit: Payer: BC Managed Care – PPO

## 2022-07-12 DIAGNOSIS — O021 Missed abortion: Secondary | ICD-10-CM | POA: Diagnosis not present

## 2022-07-26 ENCOUNTER — Ambulatory Visit: Payer: BC Managed Care – PPO

## 2022-07-30 DIAGNOSIS — N96 Recurrent pregnancy loss: Secondary | ICD-10-CM | POA: Diagnosis not present

## 2022-08-01 DIAGNOSIS — O3429 Maternal care due to uterine scar from other previous surgery: Secondary | ICD-10-CM | POA: Insufficient documentation

## 2022-08-01 DIAGNOSIS — N760 Acute vaginitis: Secondary | ICD-10-CM | POA: Diagnosis not present

## 2022-08-01 DIAGNOSIS — B9689 Other specified bacterial agents as the cause of diseases classified elsewhere: Secondary | ICD-10-CM | POA: Diagnosis not present

## 2022-08-09 ENCOUNTER — Telehealth: Payer: BC Managed Care – PPO | Admitting: Physician Assistant

## 2022-08-09 DIAGNOSIS — R3989 Other symptoms and signs involving the genitourinary system: Secondary | ICD-10-CM

## 2022-08-09 MED ORDER — AMOXICILLIN-POT CLAVULANATE 875-125 MG PO TABS
1.0000 | ORAL_TABLET | Freq: Two times a day (BID) | ORAL | 0 refills | Status: DC
Start: 2022-08-09 — End: 2022-08-30

## 2022-08-09 NOTE — Progress Notes (Signed)
E-Visit for Urinary Problems  We are sorry that you are not feeling well.  Here is how we plan to help!  Based on what you shared with me it looks like you most likely have a simple urinary tract infection.  A UTI (Urinary Tract Infection) is a bacterial infection of the bladder.  Most cases of urinary tract infections are simple to treat but a key part of your care is to encourage you to drink plenty of fluids and watch your symptoms carefully.  I have prescribed Augmentin 875-125mg  Take 1 tablet by mouth twice daily for 7 days.  Your symptoms should gradually improve. Call us if the burning in your urine worsens, you develop worsening fever, back pain or pelvic pain or if your symptoms do not resolve after completing the antibiotic.  Urinary tract infections can be prevented by drinking plenty of water to keep your body hydrated.  Also be sure when you wipe, wipe from front to back and don't hold it in!  If possible, empty your bladder every 4 hours.  HOME CARE Drink plenty of fluids Compete the full course of the antibiotics even if the symptoms resolve Remember, when you need to go.go. Holding in your urine can increase the likelihood of getting a UTI! GET HELP RIGHT AWAY IF: You cannot urinate You get a high fever Worsening back pain occurs You see blood in your urine You feel sick to your stomach or throw up You feel like you are going to pass out  MAKE SURE YOU  Understand these instructions. Will watch your condition. Will get help right away if you are not doing well or get worse.   Thank you for choosing an e-visit.  Your e-visit answers were reviewed by a board certified advanced clinical practitioner to complete your personal care plan. Depending upon the condition, your plan could have included both over the counter or prescription medications.  Please review your pharmacy choice. Make sure the pharmacy is open so you can pick up prescription now. If there is a  problem, you may contact your provider through [employer] and have the prescription routed to another pharmacy.  Your safety is important to [place]. If you have drug allergies check your prescription carefully.   For the next 24 hours you can use MyChart to ask questions about today's visit, request a non-urgent call back, or ask for a work or school excuse. You will get an email in the next two days asking about your experience. I hope that your e-visit has been valuable and will speed your recovery.  I provided 5 minutes of non face-to-face time during this encounter for chart review and documentation.

## 2022-08-30 ENCOUNTER — Telehealth (INDEPENDENT_AMBULATORY_CARE_PROVIDER_SITE_OTHER): Payer: BC Managed Care – PPO | Admitting: Cardiology

## 2022-08-30 ENCOUNTER — Encounter: Payer: Self-pay | Admitting: Cardiology

## 2022-08-30 VITALS — BP 111/64 | HR 73 | Ht 63.0 in

## 2022-08-30 DIAGNOSIS — E669 Obesity, unspecified: Secondary | ICD-10-CM

## 2022-08-30 DIAGNOSIS — I10 Essential (primary) hypertension: Secondary | ICD-10-CM

## 2022-08-30 NOTE — Progress Notes (Signed)
Cardio-Obstetrics Clinic  Follow Up Note   Date:  08/30/2022   ID:  [name], DOB Dec 16, 1985, MRN [id number]  PCP:  Irena Reichmann, DO   [facility] Providers Cardiologist:  Thomasene Ripple, DO  Electrophysiologist:  None        Referring MD: Irena Reichmann, DO   Chief Complaint: " I am ok"  History of Present Illness:    [name] is a 36 y.o. female [[id number]] who returns for follow up of    Since I saw the patient she has had sequence of events. She lost the last pregnancy through a miscarriage. But since she has conceived again and is now [redacted] weeks pregnant with twins.   I saw the patient on 05/31/2022 at that time she was on Nifedipine. NO meds changes. She was pregnant with twins.  Since her last visit she has lost her baby.   She offers no complaints.   She is at home I am in the clinic  Obtained 2 patients identifiers prior to visit.   Prior CV Studies Reviewed: The following studies were reviewed today:   Past Medical History:  Diagnosis Date   COVID 10/2019   mild - lost taste and smell for 3 weeks   H/O seasonal allergies    Hypertension    Migraine    uses Botox    Past Surgical History:  Procedure Laterality Date   CESAREAN SECTION N/A 04/30/2013   Procedure: CESAREAN SECTION;  Surgeon: Kirkland Hun, MD;  Location: [facility];  Service: Obstetrics;  Laterality: N/A;   COLONOSCOPY     DILATION AND EVACUATION N/A 07/06/2022   Procedure: DILATATION AND EVACUATION WITH INTRAOPERATIVE ULTRASOUND, REPAIR OF LABIAL LACERATION;  Surgeon: Carlisle Cater, MD;  Location: [facility];  Service: Gynecology;  Laterality: N/A;   UPPER GI ENDOSCOPY        OB History     Gravida  5   Para  1   Term  1   Preterm      AB  3   Living  1      SAB  2   IAB  1   Ectopic      Multiple      Live Births  1               Current Medications: Current Meds  Medication Sig   NIFEdipine (PROCARDIA XL/NIFEDICAL XL) 60 MG 24 hr tablet  Take 1 tablet (60 mg total) by mouth daily.     Allergies:   Bactrim [sulfamethoxazole-trimethoprim] and Topiramate   Social History   Socioeconomic History   Marital status: Single    Spouse name: Not on file   Number of children: 1   Years of education: Not on file   Highest education level: Not on file  Occupational History   Occupation: human resources  Tobacco Use   Smoking status: Never   Smokeless tobacco: Never  Vaping Use   Vaping Use: Never used  Substance and Sexual Activity   Alcohol use: Yes    Comment: occ   Drug use: No   Sexual activity: Not Currently    Birth control/protection: None  Other Topics Concern   Not on file  Social History Narrative   Lives with her child   Right handed   Caffeine: 1-2 cups/day   Social Determinants of Health   Financial Resource Strain: Not on file  Food Insecurity: No Food Insecurity (02/01/2022)   Hunger Vital Sign  Worried About Charity fundraiser in the Last Year: Never true    [place] in the Last Year: Never true  Transportation Needs: No Transportation Needs (02/01/2022)   PRAPARE - Hydrologist (Medical): No    Lack of Transportation (Non-Medical): No  Physical Activity: Not on file  Stress: Not on file  Social Connections: Not on file      Family History  Problem Relation Age of Onset   Healthy Mother    Migraines Mother    Prostate cancer Father    Healthy Son    Colon cancer Paternal Grandmother    Migraines Maternal Grandmother    Heart disease Neg Hx    Hypertension Neg Hx       ROS:   Please see the history of present illness.     All other systems reviewed and are negative.   Labs/EKG Reviewed:    EKG:   EKG is was not ordered today.    Recent Labs: 07/06/2022: BUN <5; Creatinine, Ser 0.69; Hemoglobin 11.6; Platelets 245; Potassium 3.7; Sodium 136   Recent Lipid Panel No results found for: "CHOL", "TRIG", "HDL", "CHOLHDL", "LDLCALC",  "LDLDIRECT"  Physical Exam:    VS:  BP 111/64   Pulse 73   Ht 5\' 3"  (1.6 m)   LMP 03/18/2022   BMI 37.20 kg/m     Wt Readings from Last 3 Encounters:  07/06/22 210 lb (95.3 kg)  05/31/22 220 lb 8 oz (100 kg)  01/31/22 217 lb 12.8 oz (98.8 kg)     GEN:  Well nourished, well developed in no acute distress HEENT: Normal NECK: No JVD; No carotid bruits LYMPHATICS: No lymphadenopathy CARDIAC: RRR, no murmurs, rubs, gallops RESPIRATORY:  Clear to auscultation without rales, wheezing or rhonchi  ABDOMEN: Soft, non-tender, non-distended MUSCULOSKELETAL:  No edema; No deformity  SKIN: Warm and dry NEUROLOGIC:  Alert and oriented x 3 PSYCHIATRIC:  Normal affect    Risk Assessment/Risk Calculators:              ASSESSMENT & PLAN:    Chronic hypertension  - she lost her babies.  Off ASA 81 mg.  Will continue current antihypertensive medication .   She will follow up in 6 months unless if she gets pregnant before.  Total visit time 12 minutes     Patient Instructions  Medication Instructions:  Your physician recommends that you continue on your current medications as directed. Please refer to the Current Medication list given to you today.  *If you need a refill on your cardiac medications before your next appointment, please call your pharmacy*   Lab Work: NONE If you have labs (blood work) drawn today and your tests are completely normal, you will receive your results only by: [place] (if you have MyChart) OR A paper copy in the mail If you have any lab test that is abnormal or we need to change your treatment, we will call you to review the results.   Testing/Procedures: NONE   Follow-Up: At [facility], you and your health needs are our priority.  As part of our continuing mission to provide you with exceptional heart care, we have created designated Provider Care Teams.  These Care Teams include your primary Cardiologist (physician) and  Advanced Practice Providers (APPs -  Physician Assistants and Nurse Practitioners) who all work together to provide you with the care you need, when you need it.  We recommend signing up  for the patient portal called "MyChart".  Sign up information is provided on this After Visit Summary.  MyChart is used to connect with patients for Virtual Visits (Telemedicine).  Patients are able to view lab/test results, encounter notes, upcoming appointments, etc.  Non-urgent messages can be sent to your provider as well.   To learn more about what you can do with MyChart, go to [email or web address].    Your next appointment:   6 month(s)  The format for your next appointment:   In Person  Provider:   Thomasene Ripple, DO      Dispo:  Return in about 6 months (around 03/01/2023).   Medication Adjustments/Labs and Tests Ordered: Current medicines are reviewed at length with the patient today.  Concerns regarding medicines are outlined above.  Tests Ordered: No orders of the defined types were placed in this encounter.  Medication Changes: No orders of the defined types were placed in this encounter.

## 2022-08-30 NOTE — Patient Instructions (Signed)
Medication Instructions:  Your physician recommends that you continue on your current medications as directed. Please refer to the Current Medication list given to you today.  *If you need a refill on your cardiac medications before your next appointment, please call your pharmacy*   Lab Work: NONE If you have labs (blood work) drawn today and your tests are completely normal, you will receive your results only by: MyChart Message (if you have MyChart) OR A paper copy in the mail If you have any lab test that is abnormal or we need to change your treatment, we will call you to review the results.   Testing/Procedures: NONE   Follow-Up: At [facility], you and your health needs are our priority.  As part of our continuing mission to provide you with exceptional heart care, we have created designated Provider Care Teams.  These Care Teams include your primary Cardiologist (physician) and Advanced Practice Providers (APPs -  Physician Assistants and Nurse Practitioners) who all work together to provide you with the care you need, when you need it.  We recommend signing up for the patient portal called "MyChart".  Sign up information is provided on this After Visit Summary.  MyChart is used to connect with patients for Virtual Visits (Telemedicine).  Patients are able to view lab/test results, encounter notes, upcoming appointments, etc.  Non-urgent messages can be sent to your provider as well.   To learn more about what you can do with MyChart, go to [email or web address].    Your next appointment:   6 month(s)  The format for your next appointment:   In Person  Provider:   Kardie Tobb, DO   

## 2022-09-09 ENCOUNTER — Encounter: Payer: Self-pay | Admitting: Cardiology

## 2022-09-10 DIAGNOSIS — Z3201 Encounter for pregnancy test, result positive: Secondary | ICD-10-CM | POA: Diagnosis not present

## 2022-09-12 DIAGNOSIS — Z8759 Personal history of other complications of pregnancy, childbirth and the puerperium: Secondary | ICD-10-CM | POA: Diagnosis not present

## 2022-09-26 DIAGNOSIS — Z3A01 Less than 8 weeks gestation of pregnancy: Secondary | ICD-10-CM | POA: Diagnosis not present

## 2022-09-26 DIAGNOSIS — O3680X1 Pregnancy with inconclusive fetal viability, fetus 1: Secondary | ICD-10-CM | POA: Diagnosis not present

## 2022-10-10 DIAGNOSIS — O3680X1 Pregnancy with inconclusive fetal viability, fetus 1: Secondary | ICD-10-CM | POA: Diagnosis not present

## 2022-10-10 DIAGNOSIS — Z3A08 8 weeks gestation of pregnancy: Secondary | ICD-10-CM | POA: Diagnosis not present

## 2022-10-18 ENCOUNTER — Ambulatory Visit: Payer: BC Managed Care – PPO | Attending: Cardiology | Admitting: Cardiology

## 2022-10-18 ENCOUNTER — Encounter: Payer: Self-pay | Admitting: Cardiology

## 2022-10-18 VITALS — BP 138/90 | HR 54 | Ht 64.0 in | Wt 228.4 lb

## 2022-10-18 DIAGNOSIS — O10919 Unspecified pre-existing hypertension complicating pregnancy, unspecified trimester: Secondary | ICD-10-CM | POA: Diagnosis not present

## 2022-10-18 DIAGNOSIS — Z3A10 10 weeks gestation of pregnancy: Secondary | ICD-10-CM | POA: Diagnosis not present

## 2022-10-18 DIAGNOSIS — O99210 Obesity complicating pregnancy, unspecified trimester: Secondary | ICD-10-CM | POA: Diagnosis not present

## 2022-10-18 MED ORDER — NIFEDIPINE ER OSMOTIC RELEASE 60 MG PO TB24: ORAL_TABLET | ORAL | 3 refills | Status: DC

## 2022-10-18 NOTE — Progress Notes (Signed)
Cardio-Obstetrics Clinic  Follow Up Note   Date:  10/18/2022   ID:  [name], DOB [birth year], MRN [id number]  PCP:  Janie Morning, [place] Providers Cardiologist:  Berniece Salines, DO  Electrophysiologist:  None        Referring MD: Janie Morning, DO   Chief Complaint: " I am doing   History of Present Illness:    [name] is a 36 y.o. female [id number] who returns for follow up of chronic hypertension her today to be for follow-up for chronic hypertension in pregnancy.  She has had a difficult time recently in the last recent months she is lost twin pregnancy.  She is pregnant again and is currently 10 weeks.  She offers no complaints.   Prior CV Studies Reviewed: The following studies were reviewed today:   Past Medical History:  Diagnosis Date   COVID 10/2019   mild - lost taste and smell for 3 weeks   H/O seasonal allergies    Hypertension    Migraine    uses Botox    Past Surgical History:  Procedure Laterality Date   CESAREAN SECTION N/A 04/30/2013   Procedure: CESAREAN SECTION;  Surgeon: Ena Dawley, MD;  Location: [facility];  Service: Obstetrics;  Laterality: N/A;   COLONOSCOPY     DILATION AND EVACUATION N/A 07/06/2022   Procedure: DILATATION AND EVACUATION WITH INTRAOPERATIVE ULTRASOUND, REPAIR OF LABIAL LACERATION;  Surgeon: Deliah Boston, MD;  Location: [facility];  Service: Gynecology;  Laterality: N/A;   UPPER GI ENDOSCOPY        OB History     Gravida  5   Para  1   Term  1   Preterm      AB  3   Living  1      SAB  2   IAB  1   Ectopic      Multiple      Live Births  1               Current Medications: Current Meds  Medication Sig   DICLEGIS 10-10 MG TBEC Take 2 tablets by mouth daily.   [DISCONTINUED] NIFEdipine (PROCARDIA XL/NIFEDICAL XL) 60 MG 24 hr tablet Take 1 tablet (60 mg total) by mouth daily.     Allergies:   Bactrim [sulfamethoxazole-trimethoprim] and Topiramate    Social History   Socioeconomic History   Marital status: Single    Spouse name: Not on file   Number of children: 1   Years of education: Not on file   Highest education level: Not on file  Occupational History   Occupation: human resources  Tobacco Use   Smoking status: Never   Smokeless tobacco: Never  Vaping Use   Vaping Use: Never used  Substance and Sexual Activity   Alcohol use: Yes    Comment: occ   Drug use: No   Sexual activity: Not Currently    Birth control/protection: None  Other Topics Concern   Not on file  Social History Narrative   Lives with her child   Right handed   Caffeine: 1-2 cups/day   Social Determinants of Health   Financial Resource Strain: Not on file  Food Insecurity: No Food Insecurity (02/01/2022)   Hunger Vital Sign    Worried About Running Out of Food in the Last Year: Never true    Ran Out of Food in the Last Year: Never true  Transportation Needs: No Transportation Needs (  02/01/2022)   PRAPARE - Administrator, Civil Service (Medical): No    Lack of Transportation (Non-Medical): No  Physical Activity: Not on file  Stress: Not on file  Social Connections: Not on file      Family History  Problem Relation Age of Onset   Healthy Mother    Migraines Mother    Prostate cancer Father    Healthy Son    Colon cancer Paternal Grandmother    Migraines Maternal Grandmother    Heart disease Neg Hx    Hypertension Neg Hx       ROS:   Please see the history of present illness.     All other systems reviewed and are negative.   Labs/EKG Reviewed:    EKG:   EKG is was not ordered today.    Recent Labs: 07/06/2022: BUN <5; Creatinine, Ser 0.69; Hemoglobin 11.6; Platelets 245; Potassium 3.7; Sodium 136   Recent Lipid Panel No results found for: "CHOL", "TRIG", "HDL", "CHOLHDL", "LDLCALC", "LDLDIRECT"  Physical Exam:    VS:  BP (!) 138/90   Pulse (!) 54   Ht 5\' 4"  (1.626 m)   Wt 228 lb 6.4 oz (103.6 kg)   LMP  03/18/2022   SpO2 100%   BMI 39.20 kg/m     Wt Readings from Last 3 Encounters:  10/18/22 228 lb 6.4 oz (103.6 kg)  07/06/22 210 lb (95.3 kg)  05/31/22 220 lb 8 oz (100 kg)     GEN:  Well nourished, well developed in no acute distress HEENT: Normal NECK: No JVD; No carotid bruits LYMPHATICS: No lymphadenopathy CARDIAC: RRR, no murmurs, rubs, gallops RESPIRATORY:  Clear to auscultation without rales, wheezing or rhonchi  ABDOMEN: Soft, non-tender, non-distended MUSCULOSKELETAL:  No edema; No deformity  SKIN: Warm and dry NEUROLOGIC:  Alert and oriented x 3 PSYCHIATRIC:  Normal affect    Risk Assessment/Risk Calculators:     CARPREG II Risk Prediction Index Score:  1.  The patient's risk for a primary cardiac event is 5%.            ASSESSMENT & PLAN:    Chronic hypertension pregnancy-her blood pressure today is below target but continue to monitor the patient.  Will keep her nifedipine at 60 mg in the morning and 30 mg at night. Have asked the patient to be diligent about taking her blood pressure given the fact that she is getting closer to the second trimester.  She should notify me if anything is greater than 140/90 mmHg.   Patient Instructions  Medication Instructions:  Your physician recommends that you continue on your current medications as directed. Please refer to the Current Medication list given to you today.  *If you need a refill on your cardiac medications before your next appointment, please call your pharmacy*   Lab Work: NONE If you have labs (blood work) drawn today and your tests are completely normal, you will receive your results only by: MyChart Message (if you have MyChart) OR A paper copy in the mail If you have any lab test that is abnormal or we need to change your treatment, we will call you to review the results.   Testing/Procedures: NONE   Follow-Up: At [facility], you and your health needs are our priority.  As part  of our continuing mission to provide you with exceptional heart care, we have created designated Provider Care Teams.  These Care Teams include your primary Cardiologist (physician) and Advanced Practice Providers (  APPs -  Physician Assistants and Nurse Practitioners) who all work together to provide you with the care you need, when you need it.  We recommend signing up for the patient portal called "MyChart".  Sign up information is provided on this After Visit Summary.  MyChart is used to connect with patients for Virtual Visits (Telemedicine).  Patients are able to view lab/test results, encounter notes, upcoming appointments, etc.  Non-urgent messages can be sent to your provider as well.   To learn more about what you can do with MyChart, go to [email or web address].    Your next appointment:   12 week(s)  The format for your next appointment:   In Person  Provider:   Berniece Salines, DO     Dispo:  Return in about 12 weeks (around 01/10/2023).   Medication Adjustments/Labs and Tests Ordered: Current medicines are reviewed at length with the patient today.  Concerns regarding medicines are outlined above.  Tests Ordered: No orders of the defined types were placed in this encounter.  Medication Changes: Meds ordered this encounter  Medications   NIFEdipine (PROCARDIA XL/NIFEDICAL XL) 60 MG 24 hr tablet    Sig: Take 1 tablet in the morning and 0.5 tablet in the evening.    Dispense:  30 tablet    Refill:  3

## 2022-10-18 NOTE — Patient Instructions (Signed)
Medication Instructions:  Your physician recommends that you continue on your current medications as directed. Please refer to the Current Medication list given to you today.  *If you need a refill on your cardiac medications before your next appointment, please call your pharmacy*   Lab Work: NONE If you have labs (blood work) drawn today and your tests are completely normal, you will receive your results only by: MyChart Message (if you have MyChart) OR A paper copy in the mail If you have any lab test that is abnormal or we need to change your treatment, we will call you to review the results.   Testing/Procedures: NONE   Follow-Up: At [facility], you and your health needs are our priority.  As part of our continuing mission to provide you with exceptional heart care, we have created designated Provider Care Teams.  These Care Teams include your primary Cardiologist (physician) and Advanced Practice Providers (APPs -  Physician Assistants and Nurse Practitioners) who all work together to provide you with the care you need, when you need it.  We recommend signing up for the patient portal called "MyChart".  Sign up information is provided on this After Visit Summary.  MyChart is used to connect with patients for Virtual Visits (Telemedicine).  Patients are able to view lab/test results, encounter notes, upcoming appointments, etc.  Non-urgent messages can be sent to your provider as well.   To learn more about what you can do with MyChart, go to [email or web address].    Your next appointment:   12 week(s)  The format for your next appointment:   In Person  Provider:   Kardie Tobb, DO   

## 2022-10-28 ENCOUNTER — Ambulatory Visit: Payer: BC Managed Care – PPO | Admitting: Cardiology

## 2022-11-06 DIAGNOSIS — Z113 Encounter for screening for infections with a predominantly sexual mode of transmission: Secondary | ICD-10-CM | POA: Diagnosis not present

## 2022-11-06 DIAGNOSIS — Z3A12 12 weeks gestation of pregnancy: Secondary | ICD-10-CM | POA: Diagnosis not present

## 2022-11-06 DIAGNOSIS — O26891 Other specified pregnancy related conditions, first trimester: Secondary | ICD-10-CM | POA: Diagnosis not present

## 2022-11-06 DIAGNOSIS — Z3689 Encounter for other specified antenatal screening: Secondary | ICD-10-CM | POA: Diagnosis not present

## 2022-11-06 DIAGNOSIS — O10011 Pre-existing essential hypertension complicating pregnancy, first trimester: Secondary | ICD-10-CM | POA: Diagnosis not present

## 2022-11-06 DIAGNOSIS — Z363 Encounter for antenatal screening for malformations: Secondary | ICD-10-CM | POA: Diagnosis not present

## 2022-11-06 LAB — OB RESULTS CONSOLE GC/CHLAMYDIA
Chlamydia: NEGATIVE
Neisseria Gonorrhea: NEGATIVE

## 2022-11-06 LAB — OB RESULTS CONSOLE ABO/RH: RH Type: POSITIVE

## 2022-11-06 LAB — OB RESULTS CONSOLE RUBELLA ANTIBODY, IGM: Rubella: IMMUNE

## 2022-11-06 LAB — OB RESULTS CONSOLE ANTIBODY SCREEN: Antibody Screen: NEGATIVE

## 2022-11-06 LAB — OB RESULTS CONSOLE HEPATITIS B SURFACE ANTIGEN: Hepatitis B Surface Ag: NEGATIVE

## 2022-11-06 LAB — OB RESULTS CONSOLE HIV ANTIBODY (ROUTINE TESTING): HIV: NONREACTIVE

## 2022-11-06 LAB — OB RESULTS CONSOLE RPR: RPR: NONREACTIVE

## 2022-11-06 LAB — HEPATITIS C ANTIBODY: HCV Ab: NEGATIVE

## 2022-11-17 ENCOUNTER — Inpatient Hospital Stay (HOSPITAL_COMMUNITY)
Admission: AD | Admit: 2022-11-17 | Discharge: 2022-11-17 | Disposition: A | Discharge status: Home / Self Care | Payer: BC Managed Care – PPO | Attending: Obstetrics and Gynecology | Admitting: Obstetrics and Gynecology

## 2022-11-17 ENCOUNTER — Inpatient Hospital Stay (HOSPITAL_COMMUNITY): Payer: BC Managed Care – PPO

## 2022-11-17 ENCOUNTER — Encounter (HOSPITAL_COMMUNITY): Payer: Self-pay | Admitting: *Deleted

## 2022-11-17 DIAGNOSIS — R519 Headache, unspecified: Secondary | ICD-10-CM | POA: Diagnosis not present

## 2022-11-17 DIAGNOSIS — Z3A13 13 weeks gestation of pregnancy: Secondary | ICD-10-CM | POA: Insufficient documentation

## 2022-11-17 DIAGNOSIS — G44311 Acute post-traumatic headache, intractable: Secondary | ICD-10-CM | POA: Diagnosis not present

## 2022-11-17 DIAGNOSIS — G44309 Post-traumatic headache, unspecified, not intractable: Secondary | ICD-10-CM | POA: Diagnosis not present

## 2022-11-17 DIAGNOSIS — Z8616 Personal history of COVID-19: Secondary | ICD-10-CM | POA: Insufficient documentation

## 2022-11-17 DIAGNOSIS — O99351 Diseases of the nervous system complicating pregnancy, first trimester: Secondary | ICD-10-CM | POA: Diagnosis not present

## 2022-11-17 MED ORDER — METOCLOPRAMIDE HCL 10 MG PO TABS
10.0000 mg | ORAL_TABLET | Freq: Once | ORAL | Status: AC
  Administered 2022-11-17: 10 mg via ORAL
  Filled 2022-11-17: qty 1

## 2022-11-17 MED ORDER — ACETAMINOPHEN 500 MG PO TABS: 1000.0000 mg | ORAL_TABLET | Freq: Three times a day (TID) | ORAL | 2 refills | Status: DC | PRN

## 2022-11-17 MED ORDER — ACETAMINOPHEN-CAFFEINE 500-65 MG PO TABS
2.0000 | ORAL_TABLET | Freq: Once | ORAL | Status: AC
  Administered 2022-11-17: 2 via ORAL
  Filled 2022-11-17: qty 2

## 2022-11-17 NOTE — MAU Note (Signed)
[name] is a 37 y.o.   13.6wks preg, here in MAU reporting: fell last  Tues, hit her head .  Been having some abd pain, can't get rid of HA, had it since fall. Hit forehead on the corner of wall. Took some Tylenol.  No bleeding.  Onset of complaint: last Tues!!!! Pain score: HA 7, abd  4 Vitals:   11/17/22 1112  BP: (!) 145/76  Pulse: 86  Resp: 18  Temp: 98.8 F (37.1 C)  SpO2: 99%     OEV:OJJKK briefly, did not catch rate.  Pt has had [place] confirmed viable IUP Lab orders placed from triage:

## 2022-11-17 NOTE — MAU Provider Note (Addendum)
History     [id number]  Arrival date and time: 11/17/22 1058    Chief Complaint  Patient presents with   Fall   Headache     HPI [name] is a 37 y.o. at [redacted]w[redacted]d who presents for headache s/p fall. States she fell and hit her head on the corner where 2 walls meet. Had "goose egg" above her left eye. Has had constant frontal headache since then (1/2). Has taken 500mg  of tylenol without relief. History of migraines but states this feels different than her migraines & hasn't had headaches since botox treatment. Has nausea which has been going on with the pregnancy. Denies loss of consciousness, memory loss, vomiting, visual changes, or rhinorrhea.   OB History     Gravida  6   Para  1   Term  1   Preterm      AB  4   Living  1      SAB  3   IAB  1   Ectopic      Multiple      Live Births  1           Past Medical History:  Diagnosis Date   COVID 10/2019   mild - lost taste and smell for 3 weeks   H/O seasonal allergies    Hypertension    Migraine    uses Botox    Past Surgical History:  Procedure Laterality Date   CESAREAN SECTION N/A 04/30/2013   Procedure: CESAREAN SECTION;  Surgeon: 05/02/2013, MD;  Location: [facility];  Service: Obstetrics;  Laterality: N/A;   COLONOSCOPY     DILATION AND EVACUATION N/A 07/06/2022   Procedure: DILATATION AND EVACUATION WITH INTRAOPERATIVE ULTRASOUND, REPAIR OF LABIAL LACERATION;  Surgeon: 07/08/2022, MD;  Location: [facility];  Service: Gynecology;  Laterality: N/A;   UPPER GI ENDOSCOPY      Family History  Problem Relation Age of Onset   Healthy Mother    Migraines Mother    Prostate cancer Father    Healthy Son    Colon cancer Paternal Grandmother    Migraines Maternal Grandmother    Heart disease Neg Hx    Hypertension Neg Hx     Allergies  Allergen Reactions   Bactrim [Sulfamethoxazole-Trimethoprim]     Chills, joint pain, sweating, headache   Topiramate Rash    No current  facility-administered medications on file prior to encounter.   Current Outpatient Medications on File Prior to Encounter  Medication Sig Dispense Refill   DICLEGIS 10-10 MG TBEC Take 2 tablets by mouth daily.     NIFEdipine (PROCARDIA XL/NIFEDICAL XL) 60 MG 24 hr tablet Take 1 tablet in the morning and 0.5 tablet in the evening. 30 tablet 3     Review of Systems  Constitutional:  Negative for chills and fever.  HENT:  Negative for congestion and ear pain.   Respiratory:  Negative for cough.   Cardiovascular:  Negative for palpitations.  Gastrointestinal:  Negative for vomiting.  Genitourinary:  Negative for dysuria.  Musculoskeletal:  Negative for back pain.  Skin:  Negative for rash.  Neurological:  Positive for headaches. Negative for sensory change and weakness.  Endo/Heme/Allergies:  Negative for polydipsia.  Psychiatric/Behavioral:  Negative for depression.    Pertinent positives and negative per HPI, all others reviewed and negative  Physical Exam   BP 136/72   Pulse 84   Temp 98.8 F (37.1 C) (Oral)   Resp 18  Ht 5\' 3"  (1.6 m)   Wt 103.6 kg   LMP 08/12/2022   SpO2 100% Comment: RA  Breastfeeding Unknown   BMI 40.46 kg/m   Patient Vitals for the past 24 hrs:  BP Temp Temp src Pulse Resp SpO2 Height Weight  11/17/22 1141 136/72 -- -- 84 -- -- -- --  11/17/22 1130 139/80 -- -- 89 -- 100 % -- --  11/17/22 1112 (!) 145/76 98.8 F (37.1 C) Oral 86 18 99 % 5\' 3"  (1.6 m) 103.6 kg    Physical Exam Vitals and nursing note reviewed.  Constitutional:      General: She is not in acute distress.    Appearance: Normal appearance. She is well-developed.  HENT:     Head: Normocephalic and atraumatic. No raccoon eyes, Battle's sign, abrasion or laceration.     Comments: TTP above left eye    Right Ear: External ear normal. No hemotympanum.     Left Ear: External ear normal. No hemotympanum.  Eyes:     Extraocular Movements: Extraocular movements intact.     Right  eye: No nystagmus.     Left eye: No nystagmus.     Pupils: Pupils are equal, round, and reactive to light.  Cardiovascular:     Rate and Rhythm: Normal rate and regular rhythm.  Pulmonary:     Effort: Pulmonary effort is normal. No respiratory distress.     Breath sounds: Normal breath sounds.  Abdominal:     General: Abdomen is flat.     Palpations: Abdomen is soft.  Skin:    General: Skin is warm and dry.     Capillary Refill: Capillary refill takes less than 2 seconds.  Neurological:     Mental Status: She is alert and oriented to person, place, and time.  Psychiatric:        Mood and Affect: Mood normal.    Pt informed that the ultrasound is considered a limited OB ultrasound and is not intended to be a complete ultrasound exam.  Patient also informed that the ultrasound is not being completed with the intent of assessing for fetal or placental anomalies or any pelvic abnormalities.  Explained that the purpose of today's ultrasound is to assess for  viability.  Patient acknowledges the purpose of the exam and the limitations of the study.   Active fetus. FHR 150 bpm   Labs No results found for this or any previous visit (from the past 24 hour(s)).  Imaging No results found.  MAU Course  Procedures Lab Orders  No laboratory test(s) ordered today   Meds ordered this encounter  Medications   acetaminophen-caffeine (EXCEDRIN TENSION HEADACHE) 500-65 MG per tablet 2 tablet   metoCLOPramide (REGLAN) tablet 10 mg   Imaging Orders         CT HEAD WO CONTRAST (5MM)      MDM RN unable to doppler FHT/BSUS performed, see documentation under exam  Excedrin tension & reglan given for headache Patient with constant progressively worsening headache since head trauma last week that is different than her previous migraines. Head CT ordered  Care turned over to Dr. Horatio Pel, Junie Panning, NP 11/17/2022 1:41 PM   _____________ Assessment and Plan   Headache after fall [redacted]  weeks gestation of pregnancy  Patient manage as above.  CT head negative for abnormalities.  Patient notes her headache has been relieved for the most part.  Rates her pain now at 3 out of 10.  Information given for migraine follow-up  during pregnancy.  Patient stable for discharge.  Follow-up with primary OB in the next week.  Myrtie Hawk, DO FMOB Fellow, Faculty practice [facility], Center for [facility] 11/17/22  2:25 PM

## 2022-11-18 ENCOUNTER — Encounter: Payer: Self-pay | Admitting: Cardiology

## 2022-12-05 DIAGNOSIS — O09521 Supervision of elderly multigravida, first trimester: Secondary | ICD-10-CM | POA: Diagnosis not present

## 2022-12-05 DIAGNOSIS — O09522 Supervision of elderly multigravida, second trimester: Secondary | ICD-10-CM | POA: Diagnosis not present

## 2022-12-05 DIAGNOSIS — Z368A Encounter for antenatal screening for other genetic defects: Secondary | ICD-10-CM | POA: Diagnosis not present

## 2022-12-10 ENCOUNTER — Encounter: Payer: Self-pay | Admitting: Cardiology

## 2022-12-10 NOTE — Telephone Encounter (Signed)
Spoke with pt who wrote in MyChart: "I haven't been checking my bp once a day. I took 2 readings last week and it was 138/84 & 133/75. I took it twice today and the readings are 156/95 & 164/85. I have been taking the meds twice a day." Pt stated she is taking nifedipine 60mg  twice daily. For the past 3 hours she has had a"horrible headache" and took Tylenol without relief. She is staying hydrated. Please advise on BP.

## 2022-12-17 MED ORDER — NIFEDIPINE ER OSMOTIC RELEASE 60 MG PO TB24: 60.0000 mg | ORAL_TABLET | Freq: Two times a day (BID) | ORAL | 3 refills | Status: DC

## 2022-12-17 NOTE — Addendum Note (Signed)
Addended by: Betha Loa F on: 12/17/2022 09:04 AM   Modules accepted: Orders

## 2022-12-27 DIAGNOSIS — Z363 Encounter for antenatal screening for malformations: Secondary | ICD-10-CM | POA: Diagnosis not present

## 2022-12-27 DIAGNOSIS — Z3A19 19 weeks gestation of pregnancy: Secondary | ICD-10-CM | POA: Diagnosis not present

## 2023-01-07 ENCOUNTER — Ambulatory Visit: Payer: BC Managed Care – PPO | Attending: Cardiology | Admitting: Cardiology

## 2023-01-07 ENCOUNTER — Encounter: Payer: Self-pay | Admitting: Cardiology

## 2023-01-07 VITALS — BP 124/74 | HR 80 | Ht 63.0 in | Wt 230.4 lb

## 2023-01-07 DIAGNOSIS — O10919 Unspecified pre-existing hypertension complicating pregnancy, unspecified trimester: Secondary | ICD-10-CM | POA: Diagnosis not present

## 2023-01-07 DIAGNOSIS — O0991 Supervision of high risk pregnancy, unspecified, first trimester: Secondary | ICD-10-CM | POA: Diagnosis not present

## 2023-01-07 DIAGNOSIS — Z3A21 21 weeks gestation of pregnancy: Secondary | ICD-10-CM | POA: Diagnosis not present

## 2023-01-07 DIAGNOSIS — O99210 Obesity complicating pregnancy, unspecified trimester: Secondary | ICD-10-CM

## 2023-01-07 NOTE — Progress Notes (Unsigned)
Cardio-Obstetrics Clinic  Follow Up Note   Date:  01/08/2023   ID:  [name], [name] 06-18-1986, MRN [id number]  PCP:  Janie Morning, [facility] Cardiologist:  Berniece Salines, DO  Electrophysiologist:  None        Referring MD: Janie Morning, DO   Chief Complaint: " I am doing   History of Present Illness:    [name] is a 37 y.o. female [id number] who returns for follow up of chronic hypertension her today to be for follow-up for chronic hypertension in pregnancy.  She has had a difficult time recently in the last recent months she is lost twin pregnancy.  Saw the patient December 2023 at that time we kept the patient on her nifedipine as her blood pressure is at target.  Since her last visit we had to optimize she is on nifedipine 60 mg twice a day.  She is 21 weeks and 1 day pregnant today.  No complaints   Prior CV Studies Reviewed: The following studies were reviewed today:   Past Medical History:  Diagnosis Date   COVID 10/2019   mild - lost taste and smell for 3 weeks   H/O seasonal allergies    Hypertension    Migraine    uses Botox    Past Surgical History:  Procedure Laterality Date   CESAREAN SECTION N/A 04/30/2013   Procedure: CESAREAN SECTION;  Surgeon: Ena Dawley, MD;  Location: [facility];  Service: Obstetrics;  Laterality: N/A;   COLONOSCOPY     DILATION AND EVACUATION N/A 07/06/2022   Procedure: DILATATION AND EVACUATION WITH INTRAOPERATIVE ULTRASOUND, REPAIR OF LABIAL LACERATION;  Surgeon: Deliah Boston, MD;  Location: [facility];  Service: Gynecology;  Laterality: N/A;   UPPER GI ENDOSCOPY        OB History     Gravida  6   Para  1   Term  1   Preterm      AB  4   Living  1      SAB  3   IAB  1   Ectopic      Multiple      Live Births  1               Current Medications: Current Meds  Medication Sig   aspirin EC 81 MG tablet Take 81 mg by mouth daily. Swallow whole.    NIFEdipine (PROCARDIA XL/NIFEDICAL XL) 60 MG 24 hr tablet Take 1 tablet (60 mg total) by mouth 2 (two) times daily.   [DISCONTINUED] DICLEGIS 10-10 MG TBEC Take 2 tablets by mouth daily.     Allergies:   Bactrim [sulfamethoxazole-trimethoprim] and Topiramate   Social History   Socioeconomic History   Marital status: Single    Spouse name: Not on file   Number of children: 1   Years of education: Not on file   Highest education level: Not on file  Occupational History   Occupation: human resources  Tobacco Use   Smoking status: Never   Smokeless tobacco: Never  Vaping Use   Vaping Use: Never used  Substance and Sexual Activity   Alcohol use: Not Currently    Comment: occ   Drug use: No   Sexual activity: Yes    Birth control/protection: None  Other Topics Concern   Not on file  Social History Narrative   Lives with her child   Right handed   Caffeine: 1-2 cups/day   Social  Determinants of Health   Financial Resource Strain: Not on file  Food Insecurity: No Food Insecurity (02/01/2022)   Hunger Vital Sign    Worried About Running Out of Food in the Last Year: Never true    Ran Out of Food in the Last Year: Never true  Transportation Needs: No Transportation Needs (02/01/2022)   PRAPARE - Hydrologist (Medical): No    Lack of Transportation (Non-Medical): No  Physical Activity: Not on file  Stress: Not on file  Social Connections: Not on file      Family History  Problem Relation Age of Onset   Healthy Mother    Migraines Mother    Prostate cancer Father    Healthy Son    Colon cancer Paternal Grandmother    Migraines Maternal Grandmother    Heart disease Neg Hx    Hypertension Neg Hx       ROS:   Please see the history of present illness.     All other systems reviewed and are negative.   Labs/EKG Reviewed:    EKG:   EKG is was not ordered today.    Recent Labs: 07/06/2022: BUN <5; Creatinine, Ser 0.69; Hemoglobin 11.6;  Platelets 245; Potassium 3.7; Sodium 136   Recent Lipid Panel No results found for: "CHOL", "TRIG", "HDL", "CHOLHDL", "LDLCALC", "LDLDIRECT"  Physical Exam:    VS:  BP 124/74 (BP Location: Right Arm, Patient Position: Sitting, Cuff Size: Large)   Pulse 80   Ht '5\' 3"'$  (1.6 m)   Wt 104.5 kg   LMP 08/12/2022   SpO2 99%   BMI 40.81 kg/m     Wt Readings from Last 3 Encounters:  01/07/23 104.5 kg  11/17/22 103.6 kg  10/18/22 103.6 kg     GEN:  Well nourished, well developed in no acute distress HEENT: Normal NECK: No JVD; No carotid bruits LYMPHATICS: No lymphadenopathy CARDIAC: RRR, no murmurs, rubs, gallops RESPIRATORY:  Clear to auscultation without rales, wheezing or rhonchi  ABDOMEN: Soft, non-tender, non-distended MUSCULOSKELETAL:  No edema; No deformity  SKIN: Warm and dry NEUROLOGIC:  Alert and oriented x 3 PSYCHIATRIC:  Normal affect    Risk Assessment/Risk Calculators:                  ASSESSMENT & PLAN:    Chronic hypertension pregnancy-thankfully her blood pressure is at target.  Will continue her current blood pressure medication. She will continue her preeclampsia prophylaxis with her aspirin 81 mg daily.   She should notify me if anything is greater than 140/90 mmHg.   Patient Instructions  Medication Instructions:  Your physician recommends that you continue on your current medications as directed. Please refer to the Current Medication list given to you today.  *If you need a refill on your cardiac medications before your next appointment, please call your pharmacy*   Lab Work: None   Testing/Procedures: None   Follow-Up: At [facility], you and your health needs are our priority.  As part of our continuing mission to provide you with exceptional heart care, we have created designated Provider Care Teams.  These Care Teams include your primary Cardiologist (physician) and Advanced Practice Providers (APPs -  Physician Assistants  and Nurse Practitioners) who all work together to provide you with the care you need, when you need it.   Your next appointment:   12 week(s) overbook is okay.   Provider:   Berniece Salines, DO  Dispo:  No follow-ups on file.   Medication Adjustments/Labs and Tests Ordered: Current medicines are reviewed at length with the patient today.  Concerns regarding medicines are outlined above.  Tests Ordered: No orders of the defined types were placed in this encounter.  Medication Changes: No orders of the defined types were placed in this encounter.

## 2023-01-07 NOTE — Patient Instructions (Signed)
Medication Instructions:  Your physician recommends that you continue on your current medications as directed. Please refer to the Current Medication list given to you today.  *If you need a refill on your cardiac medications before your next appointment, please call your pharmacy*   Lab Work: None   Testing/Procedures: None   Follow-Up: At [facility], you and your health needs are our priority.  As part of our continuing mission to provide you with exceptional heart care, we have created designated Provider Care Teams.  These Care Teams include your primary Cardiologist (physician) and Advanced Practice Providers (APPs -  Physician Assistants and Nurse Practitioners) who all work together to provide you with the care you need, when you need it.   Your next appointment:   12 week(s) overbook is okay.   Provider:   Berniece Salines, DO

## 2023-01-24 DIAGNOSIS — N898 Other specified noninflammatory disorders of vagina: Secondary | ICD-10-CM | POA: Diagnosis not present

## 2023-02-06 DIAGNOSIS — H01134 Eczematous dermatitis of left upper eyelid: Secondary | ICD-10-CM | POA: Diagnosis not present

## 2023-02-06 DIAGNOSIS — H01131 Eczematous dermatitis of right upper eyelid: Secondary | ICD-10-CM | POA: Diagnosis not present

## 2023-02-06 DIAGNOSIS — H01135 Eczematous dermatitis of left lower eyelid: Secondary | ICD-10-CM | POA: Diagnosis not present

## 2023-02-14 DIAGNOSIS — Z23 Encounter for immunization: Secondary | ICD-10-CM | POA: Diagnosis not present

## 2023-02-25 DIAGNOSIS — N898 Other specified noninflammatory disorders of vagina: Secondary | ICD-10-CM | POA: Diagnosis not present

## 2023-02-27 DIAGNOSIS — Z3A28 28 weeks gestation of pregnancy: Secondary | ICD-10-CM | POA: Diagnosis not present

## 2023-02-27 DIAGNOSIS — Z3689 Encounter for other specified antenatal screening: Secondary | ICD-10-CM | POA: Diagnosis not present

## 2023-02-27 DIAGNOSIS — O10013 Pre-existing essential hypertension complicating pregnancy, third trimester: Secondary | ICD-10-CM | POA: Diagnosis not present

## 2023-03-03 DIAGNOSIS — R7309 Other abnormal glucose: Secondary | ICD-10-CM | POA: Diagnosis not present

## 2023-03-27 DIAGNOSIS — O139 Gestational [pregnancy-induced] hypertension without significant proteinuria, unspecified trimester: Secondary | ICD-10-CM | POA: Diagnosis not present

## 2023-03-27 DIAGNOSIS — O99213 Obesity complicating pregnancy, third trimester: Secondary | ICD-10-CM | POA: Diagnosis not present

## 2023-03-27 DIAGNOSIS — Z3A32 32 weeks gestation of pregnancy: Secondary | ICD-10-CM | POA: Diagnosis not present

## 2023-04-10 ENCOUNTER — Encounter: Payer: Self-pay | Admitting: Cardiology

## 2023-04-10 ENCOUNTER — Ambulatory Visit: Payer: BC Managed Care – PPO | Attending: Cardiology | Admitting: Cardiology

## 2023-04-10 VITALS — BP 138/72 | HR 83 | Ht 63.0 in | Wt 236.6 lb

## 2023-04-10 DIAGNOSIS — I10 Essential (primary) hypertension: Secondary | ICD-10-CM | POA: Diagnosis not present

## 2023-04-10 DIAGNOSIS — O10013 Pre-existing essential hypertension complicating pregnancy, third trimester: Secondary | ICD-10-CM | POA: Diagnosis not present

## 2023-04-10 DIAGNOSIS — Z3A34 34 weeks gestation of pregnancy: Secondary | ICD-10-CM | POA: Diagnosis not present

## 2023-04-10 NOTE — Progress Notes (Signed)
Cardio-Obstetrics Clinic  Follow Up Note   Date:  04/11/2023   ID:  [name], DOB 1986-05-04, MRN [id number]  PCP:  Irena Reichmann, DO   [facility] Cardiologist:  Thomasene Ripple, DO  Electrophysiologist:  None        Referring MD: Irena Reichmann, DO   Chief Complaint: " I am doing   History of Present Illness:    [name] is a 37 y.o. female [[id number]] who returns for follow up of chronic hypertension her today to be for follow-up for chronic hypertension in pregnancy.  She has had a difficult time recently in the last recent months she is lost twin pregnancy.  Saw the patient December 2023 at that time we kept the patient on her nifedipine as her blood pressure is at target.  Since her last visit we had to optimize she is on nifedipine 60 mg twice a day.  She is 21 weeks and 1 day pregnant today.  No complaints  At her last visit her blood pressure was at target.  Since I saw the patient she has been doing well.   Prior CV Studies Reviewed: The following studies were reviewed today:   Past Medical History:  Diagnosis Date   COVID 10/2019   mild - lost taste and smell for 3 weeks   H/O seasonal allergies    Hypertension    Migraine    uses Botox    Past Surgical History:  Procedure Laterality Date   CESAREAN SECTION N/A 04/30/2013   Procedure: CESAREAN SECTION;  Surgeon: Kirkland Hun, MD;  Location: [facility];  Service: Obstetrics;  Laterality: N/A;   COLONOSCOPY     DILATION AND EVACUATION N/A 07/06/2022   Procedure: DILATATION AND EVACUATION WITH INTRAOPERATIVE ULTRASOUND, REPAIR OF LABIAL LACERATION;  Surgeon: Carlisle Cater, MD;  Location: [facility];  Service: Gynecology;  Laterality: N/A;   UPPER GI ENDOSCOPY        OB History     Gravida  6   Para  1   Term  1   Preterm      AB  4   Living  1      SAB  3   IAB  1   Ectopic      Multiple      Live Births  1               Current  Medications: Current Meds  Medication Sig   aspirin EC 81 MG tablet Take 81 mg by mouth daily. Swallow whole.   NIFEdipine (PROCARDIA XL/NIFEDICAL XL) 60 MG 24 hr tablet Take 1 tablet (60 mg total) by mouth 2 (two) times daily.   pantoprazole (PROTONIX) 40 MG tablet Take 40 mg by mouth daily.     Allergies:   Bactrim [sulfamethoxazole-trimethoprim] and Topiramate   Social History   Socioeconomic History   Marital status: Single    Spouse name: Not on file   Number of children: 1   Years of education: Not on file   Highest education level: Not on file  Occupational History   Occupation: human resources  Tobacco Use   Smoking status: Never   Smokeless tobacco: Never  Vaping Use   Vaping Use: Never used  Substance and Sexual Activity   Alcohol use: Not Currently    Comment: occ   Drug use: No   Sexual activity: Yes    Birth control/protection: None  Other Topics Concern   Not on file  Social History Narrative   Lives with her child   Right handed   Caffeine: 1-2 cups/day   Social Determinants of Health   Financial Resource Strain: Not on file  Food Insecurity: No Food Insecurity (02/01/2022)   Hunger Vital Sign    Worried About Running Out of Food in the Last Year: Never true    Ran Out of Food in the Last Year: Never true  Transportation Needs: No Transportation Needs (02/01/2022)   PRAPARE - Administrator, Civil Service (Medical): No    Lack of Transportation (Non-Medical): No  Physical Activity: Not on file  Stress: Not on file  Social Connections: Not on file      Family History  Problem Relation Age of Onset   Healthy Mother    Migraines Mother    Prostate cancer Father    Healthy Son    Colon cancer Paternal Grandmother    Migraines Maternal Grandmother    Heart disease Neg Hx    Hypertension Neg Hx       ROS:   Please see the history of present illness.     All other systems reviewed and are negative.   Labs/EKG Reviewed:     EKG: Sinus rhythm, heart rate 83 bpm with nonspecific ST changes.  Recent Labs: 07/06/2022: BUN <5; Creatinine, Ser 0.69; Hemoglobin 11.6; Platelets 245; Potassium 3.7; Sodium 136   Recent Lipid Panel No results found for: "CHOL", "TRIG", "HDL", "CHOLHDL", "LDLCALC", "LDLDIRECT"  Physical Exam:    VS:  BP 138/72 (BP Location: Right Arm, Patient Position: Sitting, Cuff Size: Normal)   Pulse 83   Ht 5\' 3"  (1.6 m)   Wt 236 lb 9.6 oz (107.3 kg)   LMP 08/12/2022   SpO2 99%   BMI 41.91 kg/m     Wt Readings from Last 3 Encounters:  04/10/23 236 lb 9.6 oz (107.3 kg)  01/07/23 230 lb 6.4 oz (104.5 kg)  11/17/22 228 lb 6.4 oz (103.6 kg)     GEN:  Well nourished, well developed in no acute distress HEENT: Normal NECK: No JVD; No carotid bruits LYMPHATICS: No lymphadenopathy CARDIAC: RRR, no murmurs, rubs, gallops RESPIRATORY:  Clear to auscultation without rales, wheezing or rhonchi  ABDOMEN: Soft, non-tender, non-distended MUSCULOSKELETAL:  No edema; No deformity  SKIN: Warm and dry NEUROLOGIC:  Alert and oriented x 3 PSYCHIATRIC:  Normal affect    Risk Assessment/Risk Calculators:                  ASSESSMENT & PLAN:    Chronic hypertension pregnancy-thankfully her blood pressure is at target.  Will continue her current blood pressure medication. She will continue her preeclampsia prophylaxis with her aspirin 81 mg daily.   She should notify me if anything is greater than 140/90 mmHg.  Her C-sections is planned for June 24. I will see the patient virtually first week of July postpartum.   Patient Instructions  Medication Instructions:  Your physician recommends that you continue on your current medications as directed. Please refer to the Current Medication list given to you today.  *If you need a refill on your cardiac medications before your next appointment, please call your pharmacy*   Lab Work: None   Testing/Procedures: None   Follow-Up: At [facility], you and your health needs are our priority.  As part of our continuing mission to provide you with exceptional heart care, we have created designated Provider Care Teams.  These Care Teams include your  primary Cardiologist (physician) and Advanced Practice Providers (APPs -  Physician Assistants and Nurse Practitioners) who all work together to provide you with the care you need, when you need it.   Your next appointment:   July 9th 2023 at 2pm   Provider:   Thomasene Ripple, DO    Dispo:  No follow-ups on file.   Medication Adjustments/Labs and Tests Ordered: Current medicines are reviewed at length with the patient today.  Concerns regarding medicines are outlined above.  Tests Ordered: Orders Placed This Encounter  Procedures   EKG 12-Lead   Medication Changes: No orders of the defined types were placed in this encounter.

## 2023-04-10 NOTE — Patient Instructions (Signed)
Medication Instructions:  Your physician recommends that you continue on your current medications as directed. Please refer to the Current Medication list given to you today.  *If you need a refill on your cardiac medications before your next appointment, please call your pharmacy*   Lab Work: None   Testing/Procedures: None   Follow-Up: At [facility], you and your health needs are our priority.  As part of our continuing mission to provide you with exceptional heart care, we have created designated Provider Care Teams.  These Care Teams include your primary Cardiologist (physician) and Advanced Practice Providers (APPs -  Physician Assistants and Nurse Practitioners) who all work together to provide you with the care you need, when you need it.   Your next appointment:   July 9th 2023 at 2pm   Provider:   Thomasene Ripple, DO

## 2023-04-17 DIAGNOSIS — O139 Gestational [pregnancy-induced] hypertension without significant proteinuria, unspecified trimester: Secondary | ICD-10-CM | POA: Diagnosis not present

## 2023-04-17 DIAGNOSIS — Z3A35 35 weeks gestation of pregnancy: Secondary | ICD-10-CM | POA: Diagnosis not present

## 2023-04-21 ENCOUNTER — Telehealth (HOSPITAL_COMMUNITY): Payer: Self-pay | Admitting: *Deleted

## 2023-04-21 NOTE — Patient Instructions (Signed)
[name]  04/21/2023   Your procedure is scheduled on:  05/05/2023  Arrive at 0530 at Entrance C on [address] at [facility]  and [employer]. You are invited to use the FREE valet parking or use the Visitor's parking deck.  Pick up the phone at the desk and dial [phone].  Call this number if you have problems the morning of surgery: [phone]  Remember:   Do not eat food:(After Midnight) Desps de medianoche.  Do not drink clear liquids: (After Midnight) Desps de medianoche.  Take these medicines the morning of surgery with A SIP OF WATER:  nifedipine   Do not wear jewelry, make-up or nail polish.  Do not wear lotions, powders, or perfumes. Do not wear deodorant.  Do not shave 48 hours prior to surgery.  Do not bring valuables to the hospital.  [facility] is not   responsible for any belongings or valuables brought to the hospital.  Contacts, dentures or bridgework may not be worn into surgery.  Leave suitcase in the car. After surgery it may be brought to your room.  For patients admitted to the hospital, checkout time is 11:00 AM the day of              discharge.      Please read over the following fact sheets that you were given:     Preparing for Surgery

## 2023-04-22 ENCOUNTER — Encounter (HOSPITAL_COMMUNITY): Payer: Self-pay

## 2023-04-24 DIAGNOSIS — Z3685 Encounter for antenatal screening for Streptococcus B: Secondary | ICD-10-CM | POA: Diagnosis not present

## 2023-04-24 DIAGNOSIS — O10012 Pre-existing essential hypertension complicating pregnancy, second trimester: Secondary | ICD-10-CM | POA: Diagnosis not present

## 2023-04-24 DIAGNOSIS — Z3A36 36 weeks gestation of pregnancy: Secondary | ICD-10-CM | POA: Diagnosis not present

## 2023-04-30 DIAGNOSIS — Z3A37 37 weeks gestation of pregnancy: Secondary | ICD-10-CM | POA: Diagnosis not present

## 2023-04-30 DIAGNOSIS — O139 Gestational [pregnancy-induced] hypertension without significant proteinuria, unspecified trimester: Secondary | ICD-10-CM | POA: Diagnosis not present

## 2023-04-30 DIAGNOSIS — O99213 Obesity complicating pregnancy, third trimester: Secondary | ICD-10-CM | POA: Diagnosis not present

## 2023-05-02 ENCOUNTER — Encounter (HOSPITAL_COMMUNITY)
Admission: RE | Admit: 2023-05-02 | Discharge: 2023-05-02 | Disposition: A | Discharge status: Home / Self Care | Payer: BC Managed Care – PPO | Source: Ambulatory Visit | Attending: Obstetrics and Gynecology | Admitting: Obstetrics and Gynecology

## 2023-05-02 DIAGNOSIS — Z8616 Personal history of COVID-19: Secondary | ICD-10-CM | POA: Diagnosis not present

## 2023-05-02 DIAGNOSIS — O34211 Maternal care for low transverse scar from previous cesarean delivery: Secondary | ICD-10-CM | POA: Diagnosis not present

## 2023-05-02 DIAGNOSIS — Z302 Encounter for sterilization: Secondary | ICD-10-CM | POA: Diagnosis not present

## 2023-05-02 DIAGNOSIS — D62 Acute posthemorrhagic anemia: Secondary | ICD-10-CM | POA: Diagnosis not present

## 2023-05-02 DIAGNOSIS — O10919 Unspecified pre-existing hypertension complicating pregnancy, unspecified trimester: Secondary | ICD-10-CM

## 2023-05-02 DIAGNOSIS — O139 Gestational [pregnancy-induced] hypertension without significant proteinuria, unspecified trimester: Secondary | ICD-10-CM | POA: Diagnosis not present

## 2023-05-02 DIAGNOSIS — O3429 Maternal care due to uterine scar from other previous surgery: Secondary | ICD-10-CM | POA: Insufficient documentation

## 2023-05-02 DIAGNOSIS — O9081 Anemia of the puerperium: Secondary | ICD-10-CM | POA: Diagnosis not present

## 2023-05-02 DIAGNOSIS — Z98891 History of uterine scar from previous surgery: Secondary | ICD-10-CM | POA: Diagnosis not present

## 2023-05-02 DIAGNOSIS — O1092 Unspecified pre-existing hypertension complicating childbirth: Secondary | ICD-10-CM | POA: Diagnosis not present

## 2023-05-02 DIAGNOSIS — O34219 Maternal care for unspecified type scar from previous cesarean delivery: Secondary | ICD-10-CM | POA: Diagnosis not present

## 2023-05-02 DIAGNOSIS — Z3A00 Weeks of gestation of pregnancy not specified: Secondary | ICD-10-CM | POA: Diagnosis not present

## 2023-05-02 DIAGNOSIS — Z3A38 38 weeks gestation of pregnancy: Secondary | ICD-10-CM | POA: Diagnosis not present

## 2023-05-02 DIAGNOSIS — Z01818 Encounter for other preprocedural examination: Secondary | ICD-10-CM | POA: Insufficient documentation

## 2023-05-02 DIAGNOSIS — O99214 Obesity complicating childbirth: Secondary | ICD-10-CM | POA: Diagnosis not present

## 2023-05-02 LAB — CBC
HCT: 35.8 % — ABNORMAL LOW (ref 36.0–46.0)
Hemoglobin: 11.1 g/dL — ABNORMAL LOW (ref 12.0–15.0)
MCH: 26.4 pg (ref 26.0–34.0)
MCHC: 31 g/dL (ref 30.0–36.0)
MCV: 85.2 fL (ref 80.0–100.0)
Platelets: 240 10*3/uL (ref 150–400)
RBC: 4.2 MIL/uL (ref 3.87–5.11)
RDW: 17 % — ABNORMAL HIGH (ref 11.5–15.5)
WBC: 6 10*3/uL (ref 4.0–10.5)
nRBC: 0 % (ref 0.0–0.2)

## 2023-05-02 LAB — RPR: RPR Ser Ql: NONREACTIVE

## 2023-05-02 LAB — TYPE AND SCREEN
ABO/RH(D): O POS
Antibody Screen: NEGATIVE

## 2023-05-04 ENCOUNTER — Encounter (HOSPITAL_COMMUNITY): Payer: Self-pay | Admitting: Obstetrics and Gynecology

## 2023-05-04 NOTE — H&P (Signed)
[name] is a 37 y.o. female presenting for scheduled procedure. +FM, denies VB, LOF, cramping  PNC c/b 1) H/o c-section x1 - for presumed CPD, declining TOLAC 2) CHTN on Rx - Procardia 60XL BID - on baby ASA, weekly BPP 8/8 since 34wks    *1971g/4lb6oz/46.1% 3) BMI 37 4) H/o pregnancy loss - Last pregnancy (G3)  MAB of mono-di-TIUP at aprox 12wks, s/p D&E  GBS neg. Endorses satisfied fertility OB History     Gravida  6   Para  1   Term  1   Preterm      AB  4   Living  1      SAB  3   IAB  1   Ectopic      Multiple      Live Births  1          Past Medical History:  Diagnosis Date   COVID 10/2019   mild - lost taste and smell for 3 weeks   H/O seasonal allergies    Hypertension    Migraine    uses Botox   Past Surgical History:  Procedure Laterality Date   CESAREAN SECTION N/A 04/30/2013   Procedure: CESAREAN SECTION;  Surgeon: Kirkland Hun, MD;  Location: [facility];  Service: Obstetrics;  Laterality: N/A;   COLONOSCOPY     DILATION AND EVACUATION N/A 07/06/2022   Procedure: DILATATION AND EVACUATION WITH INTRAOPERATIVE ULTRASOUND, REPAIR OF LABIAL LACERATION;  Surgeon: Carlisle Cater, MD;  Location: [facility];  Service: Gynecology;  Laterality: N/A;   UPPER GI ENDOSCOPY     Family History: family history includes Colon cancer in her paternal grandmother; Healthy in her mother and son; Migraines in her maternal grandmother and mother; Prostate cancer in her father. Social History:  reports that she has never smoked. She has never used smokeless tobacco. She reports that she does not currently use alcohol. She reports that she does not use drugs.     Maternal Diabetes: No1hr 144, 3hr 0 of 4 Genetic Screening: Normal Maternal Ultrasounds/Referrals: Normal Fetal Ultrasounds or other Referrals:  None Maternal Substance Abuse:  No Significant Maternal Medications:  None Significant Maternal Lab Results:  Group B Strep negative Number of Prenatal  Visits:greater than 3 verified prenatal visits Other Comments:  None  Review of Systems  Constitutional:  Negative for chills and fever.  Respiratory:  Negative for shortness of breath.   Cardiovascular:  Negative for chest pain, palpitations and leg swelling.  Gastrointestinal:  Negative for abdominal pain and vomiting.  Neurological:  Negative for dizziness, weakness and headaches.  Psychiatric/Behavioral:  Negative for suicidal ideas.    Maternal Medical History:  Contractions: Frequency: rare.   Fetal activity: Perceived fetal activity is normal.   Prenatal complications: PIH.   No bleeding, IUGR, oligohydramnios or polyhydramnios.   Prenatal Complications - Diabetes: none.     Last menstrual period 08/12/2022, unknown if currently breastfeeding. Exam Physical Exam Constitutional:      General: She is not in acute distress.    Appearance: She is well-developed.  HENT:     Head: Normocephalic and atraumatic.  Eyes:     Pupils: Pupils are equal, round, and reactive to light.  Cardiovascular:     Rate and Rhythm: Normal rate and regular rhythm.     Heart sounds: No murmur heard.    No gallop.  Abdominal:     Tenderness: There is no abdominal tenderness. There is no guarding or rebound.  Genitourinary:  Vagina: Normal.  Musculoskeletal:        General: Normal range of motion.     Cervical back: Normal range of motion and neck supple.  Skin:    General: Skin is warm and dry.  Neurological:     Mental Status: She is alert and oriented to person, place, and time.     Prenatal labs: ABO, Rh: --/--/O POS (06/21 4098) Antibody: NEG (06/21 0951) Rubella: Immune (12/27 0000) RPR: NON REACTIVE (06/21 0948)  HBsAg: Negative (12/27 0000)  HIV: Non-reactive (12/27 0000)  GBS:   neg  Assessment/Plan: This Is a 36yo [id number] @ 38 0/7 by LMP c/w 12wk TVUS presenting for ERLTCS/BS. R/B/A of cesarean section discussed with patient. Alternative would be vaginal delivery which  would mean shorter postpartum stay and decreased risk of bleeding. Risks of section include infection of the uterus, pelvic organs, or skin, inadvertent injury to internal organs, such as bowel or bladder. If there is major injury, extensive surgery may be required. If injury is minor, it may be treated with relative ease. Discussed possibility of excessive blood loss and transfusion. If bleeding cannot be controlled using medical or minor surgical methods, a cesarean hysterectomy may be performed which would mean no future fertility. Patient accepts the possibility of blood transfusion, if necessary. Patient understands and agrees to move forward with section.    Valerie Roys Shawndale Kilpatrick 05/04/2023, 9:26 PM

## 2023-05-05 ENCOUNTER — Inpatient Hospital Stay (HOSPITAL_COMMUNITY)
Admission: RE | Admit: 2023-05-05 | Discharge: 2023-05-07 | DRG: 784 | Disposition: A | Discharge status: Home / Self Care | Payer: BC Managed Care – PPO | Attending: Obstetrics and Gynecology | Admitting: Obstetrics and Gynecology

## 2023-05-05 ENCOUNTER — Encounter (HOSPITAL_COMMUNITY): Payer: Self-pay | Admitting: Obstetrics and Gynecology

## 2023-05-05 ENCOUNTER — Other Ambulatory Visit: Payer: Self-pay

## 2023-05-05 ENCOUNTER — Encounter (HOSPITAL_COMMUNITY)
Admission: RE | Disposition: A | Discharge status: Home / Self Care | Payer: Self-pay | Source: Home / Self Care | Attending: Obstetrics and Gynecology

## 2023-05-05 ENCOUNTER — Inpatient Hospital Stay (HOSPITAL_COMMUNITY): Payer: BC Managed Care – PPO

## 2023-05-05 DIAGNOSIS — O34211 Maternal care for low transverse scar from previous cesarean delivery: Principal | ICD-10-CM | POA: Diagnosis present

## 2023-05-05 DIAGNOSIS — O99214 Obesity complicating childbirth: Secondary | ICD-10-CM | POA: Diagnosis present

## 2023-05-05 DIAGNOSIS — Z8616 Personal history of COVID-19: Secondary | ICD-10-CM | POA: Diagnosis not present

## 2023-05-05 DIAGNOSIS — O9081 Anemia of the puerperium: Secondary | ICD-10-CM | POA: Diagnosis not present

## 2023-05-05 DIAGNOSIS — O1092 Unspecified pre-existing hypertension complicating childbirth: Secondary | ICD-10-CM | POA: Diagnosis present

## 2023-05-05 DIAGNOSIS — Z3A38 38 weeks gestation of pregnancy: Secondary | ICD-10-CM

## 2023-05-05 DIAGNOSIS — Z302 Encounter for sterilization: Secondary | ICD-10-CM

## 2023-05-05 DIAGNOSIS — O3429 Maternal care due to uterine scar from other previous surgery: Secondary | ICD-10-CM

## 2023-05-05 DIAGNOSIS — D62 Acute posthemorrhagic anemia: Secondary | ICD-10-CM | POA: Diagnosis not present

## 2023-05-05 DIAGNOSIS — O10919 Unspecified pre-existing hypertension complicating pregnancy, unspecified trimester: Secondary | ICD-10-CM

## 2023-05-05 SURGERY — Surgical Case
Anesthesia: Spinal

## 2023-05-05 MED ORDER — OXYTOCIN-SODIUM CHLORIDE 30-0.9 UT/500ML-% IV SOLN
INTRAVENOUS | Status: AC
  Filled 2023-05-05: qty 500

## 2023-05-05 MED ORDER — DIBUCAINE (PERIANAL) 1 % EX OINT: 1.0000 | TOPICAL_OINTMENT | CUTANEOUS | Status: DC | PRN

## 2023-05-05 MED ORDER — KETOROLAC TROMETHAMINE 30 MG/ML IJ SOLN
30.0000 mg | Freq: Once | INTRAMUSCULAR | Status: AC
  Administered 2023-05-05: 30 mg via INTRAVENOUS

## 2023-05-05 MED ORDER — MORPHINE SULFATE (PF) 0.5 MG/ML IJ SOLN
INTRAMUSCULAR | Status: DC | PRN
  Administered 2023-05-05: 150 ug via INTRATHECAL

## 2023-05-05 MED ORDER — ACETAMINOPHEN 160 MG/5ML PO SOLN: 1000.0000 mg | Freq: Once | ORAL | Status: DC

## 2023-05-05 MED ORDER — FENTANYL CITRATE (PF) 100 MCG/2ML IJ SOLN
INTRAMUSCULAR | Status: AC
  Filled 2023-05-05: qty 2

## 2023-05-05 MED ORDER — SOD CITRATE-CITRIC ACID 500-334 MG/5ML PO SOLN
ORAL | Status: AC
  Filled 2023-05-05: qty 30

## 2023-05-05 MED ORDER — KETOROLAC TROMETHAMINE 30 MG/ML IJ SOLN
30.0000 mg | Freq: Four times a day (QID) | INTRAMUSCULAR | Status: AC
  Administered 2023-05-05 – 2023-05-06 (×3): 30 mg via INTRAVENOUS
  Filled 2023-05-05 (×3): qty 1

## 2023-05-05 MED ORDER — SIMETHICONE 80 MG PO CHEW
80.0000 mg | CHEWABLE_TABLET | Freq: Three times a day (TID) | ORAL | Status: DC
  Administered 2023-05-05 – 2023-05-07 (×5): 80 mg via ORAL
  Filled 2023-05-05 (×6): qty 1

## 2023-05-05 MED ORDER — ACETAMINOPHEN 10 MG/ML IV SOLN
INTRAVENOUS | Status: DC | PRN
  Administered 2023-05-05: 1000 mg via INTRAVENOUS

## 2023-05-05 MED ORDER — NALOXONE HCL 4 MG/10ML IJ SOLN: 1.0000 ug/kg/h | INTRAVENOUS | Status: DC | PRN

## 2023-05-05 MED ORDER — ACETAMINOPHEN 500 MG PO TABS
1000.0000 mg | ORAL_TABLET | Freq: Four times a day (QID) | ORAL | Status: DC
  Administered 2023-05-05 – 2023-05-07 (×8): 1000 mg via ORAL
  Filled 2023-05-05 (×8): qty 2

## 2023-05-05 MED ORDER — FENTANYL CITRATE (PF) 100 MCG/2ML IJ SOLN: 25.0000 ug | INTRAMUSCULAR | Status: DC | PRN

## 2023-05-05 MED ORDER — DEXAMETHASONE SODIUM PHOSPHATE 10 MG/ML IJ SOLN
INTRAMUSCULAR | Status: DC | PRN
  Administered 2023-05-05: 10 mg via INTRAVENOUS

## 2023-05-05 MED ORDER — ACETAMINOPHEN 500 MG PO TABS
1000.0000 mg | ORAL_TABLET | Freq: Four times a day (QID) | ORAL | Status: DC
Start: 2023-05-05 — End: 2023-05-05

## 2023-05-05 MED ORDER — CEFAZOLIN SODIUM-DEXTROSE 2-4 GM/100ML-% IV SOLN
INTRAVENOUS | Status: AC
  Filled 2023-05-05: qty 100

## 2023-05-05 MED ORDER — MENTHOL 3 MG MT LOZG: 1.0000 | LOZENGE | OROMUCOSAL | Status: DC | PRN

## 2023-05-05 MED ORDER — STERILE WATER FOR IRRIGATION IR SOLN
Status: DC | PRN
  Administered 2023-05-05: 1000 mL

## 2023-05-05 MED ORDER — MORPHINE SULFATE (PF) 0.5 MG/ML IJ SOLN
INTRAMUSCULAR | Status: AC
  Filled 2023-05-05: qty 10

## 2023-05-05 MED ORDER — DIPHENHYDRAMINE HCL 50 MG/ML IJ SOLN: 12.5000 mg | INTRAMUSCULAR | Status: DC | PRN

## 2023-05-05 MED ORDER — SODIUM CHLORIDE 0.9 % IR SOLN
Status: DC | PRN
  Administered 2023-05-05: 1

## 2023-05-05 MED ORDER — SENNOSIDES-DOCUSATE SODIUM 8.6-50 MG PO TABS
2.0000 | ORAL_TABLET | Freq: Every day | ORAL | Status: DC
  Administered 2023-05-06 – 2023-05-07 (×2): 2 via ORAL
  Filled 2023-05-05 (×2): qty 2

## 2023-05-05 MED ORDER — PHENYLEPHRINE 80 MCG/ML (10ML) SYRINGE FOR IV PUSH (FOR BLOOD PRESSURE SUPPORT)
PREFILLED_SYRINGE | INTRAVENOUS | Status: AC
  Filled 2023-05-05: qty 10

## 2023-05-05 MED ORDER — ACETAMINOPHEN 500 MG PO TABS
1000.0000 mg | ORAL_TABLET | Freq: Once | ORAL | Status: DC
Start: 2023-05-05 — End: 2023-05-05

## 2023-05-05 MED ORDER — OXYTOCIN-SODIUM CHLORIDE 30-0.9 UT/500ML-% IV SOLN
INTRAVENOUS | Status: DC | PRN
  Administered 2023-05-05: 200 mL via INTRAVENOUS

## 2023-05-05 MED ORDER — DIPHENHYDRAMINE HCL 25 MG PO CAPS: 25.0000 mg | ORAL_CAPSULE | Freq: Four times a day (QID) | ORAL | Status: DC | PRN

## 2023-05-05 MED ORDER — NIFEDIPINE ER OSMOTIC RELEASE 30 MG PO TB24
60.0000 mg | ORAL_TABLET | Freq: Two times a day (BID) | ORAL | Status: DC
  Administered 2023-05-05 – 2023-05-07 (×4): 60 mg via ORAL
  Filled 2023-05-05 (×4): qty 2

## 2023-05-05 MED ORDER — IBUPROFEN 600 MG PO TABS
600.0000 mg | ORAL_TABLET | Freq: Four times a day (QID) | ORAL | Status: DC
  Administered 2023-05-06 – 2023-05-07 (×5): 600 mg via ORAL
  Filled 2023-05-05 (×5): qty 1

## 2023-05-05 MED ORDER — LACTATED RINGERS IV SOLN: INTRAVENOUS | Status: DC

## 2023-05-05 MED ORDER — DEXAMETHASONE SODIUM PHOSPHATE 4 MG/ML IJ SOLN
INTRAMUSCULAR | Status: AC
  Filled 2023-05-05: qty 2

## 2023-05-05 MED ORDER — SOD CITRATE-CITRIC ACID 500-334 MG/5ML PO SOLN
30.0000 mL | ORAL | Status: AC
  Administered 2023-05-05: 30 mL via ORAL

## 2023-05-05 MED ORDER — OXYCODONE HCL 5 MG PO TABS
5.0000 mg | ORAL_TABLET | ORAL | Status: DC | PRN
  Administered 2023-05-06 – 2023-05-07 (×2): 5 mg via ORAL
  Administered 2023-05-07: 10 mg via ORAL
  Filled 2023-05-05: qty 2
  Filled 2023-05-05 (×2): qty 1

## 2023-05-05 MED ORDER — METOCLOPRAMIDE HCL 5 MG/ML IJ SOLN
INTRAMUSCULAR | Status: AC
  Filled 2023-05-05: qty 2

## 2023-05-05 MED ORDER — KETOROLAC TROMETHAMINE 30 MG/ML IJ SOLN: 30.0000 mg | Freq: Four times a day (QID) | INTRAMUSCULAR | Status: AC | PRN

## 2023-05-05 MED ORDER — SIMETHICONE 80 MG PO CHEW: 80.0000 mg | CHEWABLE_TABLET | ORAL | Status: DC | PRN

## 2023-05-05 MED ORDER — CEFAZOLIN SODIUM-DEXTROSE 2-4 GM/100ML-% IV SOLN
2.0000 g | INTRAVENOUS | Status: AC
  Administered 2023-05-05: 2 g via INTRAVENOUS

## 2023-05-05 MED ORDER — SODIUM CHLORIDE 0.9% FLUSH: 3.0000 mL | INTRAVENOUS | Status: DC | PRN

## 2023-05-05 MED ORDER — ONDANSETRON HCL 4 MG/2ML IJ SOLN
INTRAMUSCULAR | Status: AC
  Filled 2023-05-05: qty 2

## 2023-05-05 MED ORDER — FENTANYL CITRATE (PF) 100 MCG/2ML IJ SOLN
INTRAMUSCULAR | Status: DC | PRN
  Administered 2023-05-05: 15 ug via INTRATHECAL

## 2023-05-05 MED ORDER — PHENYLEPHRINE HCL-NACL 20-0.9 MG/250ML-% IV SOLN
INTRAVENOUS | Status: DC | PRN
  Administered 2023-05-05: 60 ug/min via INTRAVENOUS

## 2023-05-05 MED ORDER — ZOLPIDEM TARTRATE 5 MG PO TABS: 5.0000 mg | ORAL_TABLET | Freq: Every evening | ORAL | Status: DC | PRN

## 2023-05-05 MED ORDER — ONDANSETRON HCL 4 MG/2ML IJ SOLN
INTRAMUSCULAR | Status: DC | PRN
  Administered 2023-05-05: 4 mg via INTRAVENOUS

## 2023-05-05 MED ORDER — POVIDONE-IODINE 10 % EX SWAB
2.0000 | Freq: Once | CUTANEOUS | Status: AC
  Administered 2023-05-05: 2 via TOPICAL

## 2023-05-05 MED ORDER — PRENATAL MULTIVITAMIN CH
1.0000 | ORAL_TABLET | Freq: Every day | ORAL | Status: DC
  Administered 2023-05-07: 1 via ORAL
  Filled 2023-05-05: qty 1

## 2023-05-05 MED ORDER — DROPERIDOL 2.5 MG/ML IJ SOLN: 0.6250 mg | Freq: Once | INTRAMUSCULAR | Status: DC | PRN

## 2023-05-05 MED ORDER — OXYTOCIN-SODIUM CHLORIDE 30-0.9 UT/500ML-% IV SOLN: 2.5000 [IU]/h | INTRAVENOUS | Status: AC

## 2023-05-05 MED ORDER — ONDANSETRON HCL 4 MG/2ML IJ SOLN: 4.0000 mg | Freq: Three times a day (TID) | INTRAMUSCULAR | Status: DC | PRN

## 2023-05-05 MED ORDER — PHENYLEPHRINE HCL-NACL 20-0.9 MG/250ML-% IV SOLN
INTRAVENOUS | Status: AC
  Filled 2023-05-05: qty 250

## 2023-05-05 MED ORDER — BUPIVACAINE IN DEXTROSE 0.75-8.25 % IT SOLN
INTRATHECAL | Status: DC | PRN
  Administered 2023-05-05: 1.6 mL via INTRATHECAL

## 2023-05-05 MED ORDER — DIPHENHYDRAMINE HCL 25 MG PO CAPS: 25.0000 mg | ORAL_CAPSULE | ORAL | Status: DC | PRN

## 2023-05-05 MED ORDER — ACETAMINOPHEN 10 MG/ML IV SOLN
INTRAVENOUS | Status: AC
  Filled 2023-05-05: qty 100

## 2023-05-05 MED ORDER — WITCH HAZEL-GLYCERIN EX PADS: 1.0000 | MEDICATED_PAD | CUTANEOUS | Status: DC | PRN

## 2023-05-05 MED ORDER — NALOXONE HCL 0.4 MG/ML IJ SOLN: 0.4000 mg | INTRAMUSCULAR | Status: DC | PRN

## 2023-05-05 MED ORDER — COCONUT OIL OIL
1.0000 | TOPICAL_OIL | Status: DC | PRN
  Administered 2023-05-06 (×2): 1 via TOPICAL

## 2023-05-05 SURGICAL SUPPLY — 39 items
ADH SKN CLS APL DERMABOND .7 (GAUZE/BANDAGES/DRESSINGS) ×1
ADH SKNCLS APL OCTYL .7 VIOL (GAUZE/BANDAGES/DRESSINGS) ×1
CHLORAPREP W/TINT 26ML (MISCELLANEOUS) ×2 IMPLANT
CLAMP UMBILICAL CORD (MISCELLANEOUS) ×1 IMPLANT
CLOTH BEACON ORANGE TIMEOUT ST (SAFETY) ×1 IMPLANT
DERMABOND ADVANCED .7 DNX12 (GAUZE/BANDAGES/DRESSINGS) ×1 IMPLANT
DRSG OPSITE POSTOP 4X10 (GAUZE/BANDAGES/DRESSINGS) ×1 IMPLANT
ELECT REM PT RETURN 9FT ADLT (ELECTROSURGICAL) ×1
ELECTRODE REM PT RTRN 9FT ADLT (ELECTROSURGICAL) ×1 IMPLANT
EXTRACTOR VACUUM BELL STYLE (SUCTIONS) IMPLANT
GAUZE SPONGE 4X4 12PLY STRL LF (GAUZE/BANDAGES/DRESSINGS) IMPLANT
GLOVE BIO SURGEON STRL SZ 6.5 (GLOVE) ×1 IMPLANT
GLOVE BIOGEL PI IND STRL 6.5 (GLOVE) ×1 IMPLANT
GLOVE BIOGEL PI IND STRL 7.0 (GLOVE) ×2 IMPLANT
GOWN STRL REUS W/TWL LRG LVL3 (GOWN DISPOSABLE) ×2 IMPLANT
HEMOSTAT ARISTA ABSORB 3G PWDR (HEMOSTASIS) IMPLANT
KIT ABG SYR 3ML LUER SLIP (SYRINGE) IMPLANT
LIGASURE IMPACT 36 18CM CVD LR (INSTRUMENTS) IMPLANT
NDL HYPO 25X5/8 SAFETYGLIDE (NEEDLE) IMPLANT
NEEDLE HYPO 25X5/8 SAFETYGLIDE (NEEDLE) IMPLANT
NS IRRIG 1000ML POUR BTL (IV SOLUTION) ×1 IMPLANT
PACK C SECTION WH (CUSTOM PROCEDURE TRAY) ×1 IMPLANT
PAD ABD 8X10 STRL (GAUZE/BANDAGES/DRESSINGS) IMPLANT
PAD OB MATERNITY 4.3X12.25 (PERSONAL CARE ITEMS) ×1 IMPLANT
PENCIL SMOKE EVAC W/HOLSTER (ELECTROSURGICAL) ×1 IMPLANT
RTRCTR C-SECT PINK 25CM LRG (MISCELLANEOUS) IMPLANT
SUT PDS AB 0 CTX 36 PDP370T (SUTURE) IMPLANT
SUT PLAIN 2 0 (SUTURE)
SUT PLAIN 2 0 XLH (SUTURE) IMPLANT
SUT PLAIN ABS 2-0 CT1 27XMFL (SUTURE) IMPLANT
SUT VIC AB 0 CT1 36 (SUTURE) ×2 IMPLANT
SUT VIC AB 2-0 CT1 27 (SUTURE) ×1
SUT VIC AB 2-0 CT1 TAPERPNT 27 (SUTURE) ×1 IMPLANT
SUT VIC AB 3-0 SH 27 (SUTURE)
SUT VIC AB 3-0 SH 27X BRD (SUTURE) IMPLANT
SUT VIC AB 4-0 KS 27 (SUTURE) ×1 IMPLANT
TOWEL OR 17X24 6PK STRL BLUE (TOWEL DISPOSABLE) ×1 IMPLANT
TRAY FOLEY W/BAG SLVR 14FR LF (SET/KITS/TRAYS/PACK) ×1 IMPLANT
WATER STERILE IRR 1000ML POUR (IV SOLUTION) ×1 IMPLANT

## 2023-05-05 NOTE — Lactation Note (Signed)
This note was copied from a baby's chart. Lactation Consultation Note  Patient Name: [name] [id number] Date: 05/05/2023 Age:35 hours Reason for consult: Initial assessment;Early term 37-38.6wks (See Birth Parent MR- C/S delivery). P2, ETI female infant, had 3 stools and one void since birth. Per Birth Parent infant is breastfeeding well no concerns for LC at this time. Infant recently BF for 20 minutes at 1723 pm. Birth Parent already knows how to hand express and demonstrated to [facility] and infant was given 1 ml of EBM by spoon. Birth Parent will continue to BF infant by cues, on demand, 8 to 12+ times within 24 hours, skin to skin. Birth Parent knows to call RN/LC for further latch assistance if needed. LC discussed importance of maternal diet, rest and hydration.   Maternal Data Has patient been taught Hand Expression?: Yes Does the patient have breastfeeding experience prior to this delivery?: Yes How long did the patient breastfeed?: Per Birth Parent, 1st child for 6 months, he is currently 17 years old.  Feeding Mother's Current Feeding Choice: Breast Milk  LATCH Score  LC did not observe latch due infant recently BF for 20 minutes at 1723 pm.                  Lactation Tools Discussed/Used    Interventions Interventions: Education;Skin to skin;Breast feeding basics reviewed;Breast compression;Hand express;LC Services brochure  Discharge Pump: DEBP;Personal (Birth Parent brought her Spectra 2 DEBP from home.)  Consult Status Consult Status: Follow-up Date: 05/06/23 Follow-up type: In-patient    Frederico Hamman 05/05/2023, 6:34 PM

## 2023-05-05 NOTE — Op Note (Addendum)
C-Section Operative Note  Date: 05/05/23  Preoperative Diagnosis: history of section x1, satisfied fertility, CHTN on meds Postoperative Diagnosis: same as above Procedure: ERLTCS + bilateral salpingectomy Surgeon: Ellison Hughs MD Indications: declining TOLAC Findings: Viable female infant weighing 2850 with APGARS of 9 and 9 at 1 and 5 minutes, respectively. Normal appearing uterus, bilateral fallopian tubes and ovaries. Specimens: placenta to L&D, bilateral fallopian tubes to pathology EBL 773cc IVF 2L UOP 50cc  Consent:  R/B/A of cesarean section discussed with patient. Alternative would be vaginal delivery which would mean shorter postpartum stay and decreased risk of bleeding. Risks of section include infection of the uterus, pelvic organs, or skin, inadvertent injury to internal organs, such as bowel or bladder. If there is major injury, extensive surgery may be required. If injury is minor, it may be treated with relative ease. Discussed possibility of excessive blood loss and transfusion. If bleeding cannot be controlled using medical or minor surgical methods, a cesarean hysterectomy may be performed which would mean no future fertility. Patient accepts the possibility of blood transfusion, if necessary. Patient understands and agrees to move forward with section.   Operative Procedure: Patient was taken to the operating room where epidural anesthesia was found to be adequate by Allis clamp test. She was prepped and draped in the normal sterile fashion in the dorsal supine position with a leftward tilt. An appropriate time out was performed. A Pfannenstiel skin incision was then made with the scalpel and carried through to the underlying layer of fascia by sharp dissection and Bovie cautery. The fascia was nicked in the midline and the incision was extended laterally with Mayo scissors. The superior aspect of the incision was grasped Coker clamps and dissected off the underlying rectus  muscles. In a similar fashion the inferior aspect was dissected off the rectus muscles. Rectus muscles were separated in the midline and the peritoneal cavity entered bluntly. The peritoneal incision was then extended both superiorly and inferiorly with careful attention to avoid both bowel and bladder. The Alexis self-retaining wound retractor was then placed within the incision and the lower uterine segment exposed. The bladder flap was developed with Metzenbaum scissors and pushed away from the lower uterine segment. The lower uterine segment was then incised in a low transverse fashion and the cavity itself entered bluntly. The incision was extended bluntly. Amniotic sac was ruptured and fluid was noted to be clear in color. The infant's head was then lifted and delivered from the incision without difficulty using the standard movements. The remainder of the infant delivered and the nose and mouth bulb suctioned with the cord clamped and cut as well. The infant was handed off to the waiting pediatricians. The placenta was then spontaneously expressed from the uterus and the uterus cleared of all clots and debris with moist lap sponge. The uterine incision was then repaired in 2 layers the first layer was a running locked layer of 0-vicryl and the second an imbricating layer of the same suture. The tubes and ovaries were inspected and the gutters cleared of all clots and debris. Arista applied over incision. Attention turned to left fallopian tube. Ligasure open device used to transect mesosalpinx, taking care to include all visible fimbriae with specimen. Left tube passed off table, process repeated on right. Excellent hemostasis noted for both sites.The uterine incision was inspected and found to be hemostatic. All instruments and sponges as well as the Alexis retractor were then removed from the abdomen. The fascia was then closed with  0 Vicryl in a running fashion. Subcutaneous tissue was reapproximated with  3-0 plain in a running fashion. The skin was closed with a subcuticular stitch of 4-0 Vicryl on a Keith needle and then reinforced with Dermabond and a Honeycomb. At the conclusion of the procedure all instruments and sponge counts were correct. Patient was taken to the recovery room in good condition with her baby accompanying her skin to skin.

## 2023-05-05 NOTE — Anesthesia Preprocedure Evaluation (Addendum)
Anesthesia Evaluation  Patient identified by MRN, date of birth, ID band Patient awake    Reviewed: Allergy & Precautions, NPO status , Patient's Chart, lab work & pertinent test results  History of Anesthesia Complications Negative for: history of anesthetic complications  Airway Mallampati: II  TM Distance: >3 FB Neck ROM: Full    Dental no notable dental hx.    Pulmonary neg pulmonary ROS   Pulmonary exam normal        Cardiovascular hypertension, Pt. on medications Normal cardiovascular exam     Neuro/Psych  Headaches  Anxiety        GI/Hepatic Neg liver ROS,GERD  ,,  Endo/Other    Morbid obesity  Renal/GU negative Renal ROS  negative genitourinary   Musculoskeletal negative musculoskeletal ROS (+)    Abdominal   Peds  Hematology negative hematology ROS (+)   Anesthesia Other Findings Day of surgery medications reviewed with patient.  Reproductive/Obstetrics (+) Pregnancy                             Anesthesia Physical Anesthesia Plan  ASA: 3  Anesthesia Plan: Spinal   Post-op Pain Management:    Induction:   PONV Risk Score and Plan: 4 or greater and Treatment may vary due to age or medical condition, Ondansetron and Dexamethasone  Airway Management Planned: Natural Airway  Additional Equipment: None  Intra-op Plan:   Post-operative Plan:   Informed Consent: I have reviewed the patients History and Physical, chart, labs and discussed the procedure including the risks, benefits and alternatives for the proposed anesthesia with the patient or authorized representative who has indicated his/her understanding and acceptance.       Plan Discussed with: CRNA  Anesthesia Plan Comments:        Anesthesia Quick Evaluation

## 2023-05-05 NOTE — Anesthesia Procedure Notes (Signed)
Spinal  Patient location during procedure: OR Start time: 05/05/2023 7:41 AM End time: 05/05/2023 7:44 AM Reason for block: surgical anesthesia Staffing Performed: anesthesiologist  Anesthesiologist: Kaylyn Layer, MD Performed by: Kaylyn Layer, MD Authorized by: Kaylyn Layer, MD   Preanesthetic Checklist Completed: patient identified, IV checked, risks and benefits discussed, monitors and equipment checked, pre-op evaluation and timeout performed Spinal Block Patient position: sitting Prep: DuraPrep and site prepped and draped Patient monitoring: heart rate, continuous pulse ox and blood pressure Approach: midline Location: L3-4 Injection technique: single-shot Needle Needle type: Pencan  Needle gauge: 24 G Needle length: 10 cm Assessment Sensory level: T4 Events: CSF return Additional Notes Risks, benefits, and alternative discussed. Patient gave consent to procedure. Prepped and draped in sitting position. Clear CSF obtained after one needle redirection. Positive terminal aspiration. No pain or paraesthesias with injection. Patient tolerated procedure well. Vital signs stable. Amalia Greenhouse, MD

## 2023-05-05 NOTE — Anesthesia Postprocedure Evaluation (Signed)
Anesthesia Post Note  Patient: [name]  Procedure(s) Performed: CESAREAN SECTION WITH BILATERAL TUBAL LIGATION     Patient location during evaluation: PACU Anesthesia Type: Spinal Level of consciousness: awake and alert Pain management: pain level controlled Vital Signs Assessment: post-procedure vital signs reviewed and stable Respiratory status: spontaneous breathing, nonlabored ventilation and respiratory function stable Cardiovascular status: blood pressure returned to baseline Postop Assessment: no apparent nausea or vomiting, spinal receding, no headache and no backache Anesthetic complications: no   No notable events documented.   Shanda Howells

## 2023-05-05 NOTE — Interval H&P Note (Signed)
History and Physical Interval Note:  05/05/2023 7:36 AM  [name]  has presented today for surgery, with the diagnosis of previous c-section and sterilization.  The various methods of treatment have been discussed with the patient and family. After consideration of risks, benefits and other options for treatment, the patient has consented to  Procedure(s): CESAREAN SECTION WITH BILATERAL TUBAL LIGATION (N/A) as a surgical intervention.  The patient's history has been reviewed, patient examined, no change in status, stable for surgery.  I have reviewed the patient's chart and labs.  Questions were answered to the patient's satisfaction.     Reganne Messerschmidt M Kassadie Pancake

## 2023-05-05 NOTE — Transfer of Care (Signed)
Immediate Anesthesia Transfer of Care Note  Patient: [name]  Procedure(s) Performed: CESAREAN SECTION WITH BILATERAL TUBAL LIGATION  Patient Location: PACU  Anesthesia Type:Spinal  Level of Consciousness: awake, alert , and oriented  Airway & Oxygen Therapy: Patient Spontanous Breathing  Post-op Assessment: Report given to RN and Post -op Vital signs reviewed and stable  Post vital signs: Reviewed and stable  Last Vitals:  Vitals Value Taken Time  BP 117/66 05/05/23 0915  Temp    Pulse 62 05/05/23 0916  Resp 17 05/05/23 0916  SpO2 96 % 05/05/23 0916  Vitals shown include unvalidated device data.  Last Pain:  Vitals:   05/05/23 0641  TempSrc:   PainSc: 0-No pain         Complications: No notable events documented.

## 2023-05-06 LAB — CBC
HCT: 27.4 % — ABNORMAL LOW (ref 36.0–46.0)
Hemoglobin: 8.7 g/dL — ABNORMAL LOW (ref 12.0–15.0)
MCH: 27 pg (ref 26.0–34.0)
MCHC: 31.8 g/dL (ref 30.0–36.0)
MCV: 85.1 fL (ref 80.0–100.0)
Platelets: 195 10*3/uL (ref 150–400)
RBC: 3.22 MIL/uL — ABNORMAL LOW (ref 3.87–5.11)
RDW: 16.8 % — ABNORMAL HIGH (ref 11.5–15.5)
WBC: 8.3 10*3/uL (ref 4.0–10.5)
nRBC: 0 % (ref 0.0–0.2)

## 2023-05-06 NOTE — Lactation Note (Signed)
This note was copied from a baby's chart. Lactation Consultation Note  Patient Name: [name] [id number] Date: 05/06/2023 Age:35 hours Reason for consult: Follow-up assessment;Early term 37-38.6wks;Infant < 6lbs Mom stated it is time to BF baby she was glad LC came. Mom stated her nipples are really sore. LC discussed positioning, support, and props. Mom demonstrated colostrum. Encouraged to pat colostrum on her nipples and let air dry. Baby latched well in football position. Mom stated it was hurting. Baby had a great flange. As baby suckled the pain got better. Explained that is because they are sore. If pain not getting better after 3 sucks then try to flange lips better.  Mom stated it was getting better. Mom asked when is it a good time to start pumping. LC discussed that w/mom. Since baby is 6.4 lbs at birth and has dropped down below 6 lbs mom might want to think about it. Baby has had good output for now. Discussed if out put decreases or if baby gets sleepier and doesn't want to feed a lot then it's time to supplement. Discussed supplementation options and pumping. Mom stated she brought her pump and would rather use that. Since mom's nipples are sore, suggested play it by ear right now and monitor I&O closely. May need to supplement before morning.  Reported to RN.  Maternal Data Has patient been taught Hand Expression?: Yes Does the patient have breastfeeding experience prior to this delivery?: Yes How long did the patient breastfeed?: 6 months to her now 35 yr old  Feeding    LATCH Score Latch: Grasps breast easily, tongue down, lips flanged, rhythmical sucking.  Audible Swallowing: None  Type of Nipple: Everted at rest and after stimulation  Comfort (Breast/Nipple): Filling, red/small blisters or bruises, mild/mod discomfort (nipples sore, Lt. nipple bruised)  Hold (Positioning): Assistance needed to correctly position infant at breast and maintain latch.  LATCH  Score: 6   Lactation Tools Discussed/Used Tools: Coconut oil  Interventions    Discharge    Consult Status Consult Status: Follow-up Date: 05/07/23 Follow-up type: In-patient    Charyl Dancer 05/06/2023, 9:58 PM

## 2023-05-06 NOTE — Progress Notes (Signed)
Subjective: Postpartum Day 1: Cesarean Delivery Patient reports, no nausea or vomiting, tolerating p.o., ambulating and voiding without difficulty.  Denies dizziness.  Postpartum lochia appropriate.  Objective: Vital signs in last 24 hours: Temp:  [97.9 F (36.6 C)-98.4 F (36.9 C)] 97.9 F (36.6 C) (06/25 0557) Pulse Rate:  [58-72] 58 (06/25 0557) Resp:  [16-18] 17 (06/25 0557) BP: (112-128)/(51-73) 128/66 (06/25 0557) SpO2:  [97 %-98 %] 97 % (06/25 0557)  Physical Exam:  General: alert, cooperative, and appears stated age Lochia: appropriate Uterine Fundus: firm Incision: healing well DVT Evaluation: No evidence of DVT seen on physical exam.  Recent Labs    05/06/23 0525  HGB 8.7*  HCT 27.4*    Assessment/Plan: Status post Cesarean section. Doing well postoperatively.  Continue current care. Acute blood loss anemia: Continue p.o. iron Chronic hypertension: Continue Procardia 60 mg XL  Waynard Reeds, MD 05/06/2023, 12:09 PM

## 2023-05-07 LAB — SURGICAL PATHOLOGY

## 2023-05-07 LAB — BIRTH TISSUE RECOVERY COLLECTION (PLACENTA DONATION)

## 2023-05-07 MED ORDER — OXYCODONE HCL 5 MG PO TABS: 5.0000 mg | ORAL_TABLET | ORAL | 0 refills | Status: DC | PRN

## 2023-05-07 NOTE — Lactation Note (Signed)
This note was copied from a baby's chart. Lactation Consultation Note  Patient Name: [name] [id number] Date: 05/07/2023 Age:35 hours Reason for consult: Mother's request;Early term 37-38.6wks  LC in to room, parents are expecting discharge. Infant is sleeping upon arrival. Birthing parent's goal is to exclusively breastfeed but started supplementing with formula. Birthing parent reports collecting very little expressed breast milk with hand expression. LC gave volume guidelines for formula feeding per infant's age. Reviewed hand expression technique, milk is easily expressed. Provided a manual pump with 21-mm flange for additional stimulation and nipple eversion to left breast. Birthing parent reports difficulty latching to left breast. Discussed normal behavior and patterns after 24h, voids and stools as signs good intake, pumping, clusterfeeding, skin to skin. Talked about milk coming into volume and managing engorgement.   Plan: 1-Feeding on demand or 8-12 times in 24h period. 2-Hand express/pump as needed for supplementation 3-If using supplementation, follow provided guidelines and pacing. 4-Encouraged birthing parent rest, hydration and food intake.   Contact LC as needed for feeds/support/concerns/questions. All questions answered at this time. Reviewed local resources for support.    Maternal Data Has patient been taught Hand Expression?: Yes Does the patient have breastfeeding experience prior to this delivery?: Yes  Feeding Mother's Current Feeding Choice: Breast Milk and Formula  LATCH Score No latch observed during this encounter.   Lactation Tools Discussed/Used Tools: Pump;Flanges;Coconut oil Flange Size: 21 Breast pump type: Manual Pump Education: Setup, frequency, and cleaning;Milk Storage Reason for Pumping: Mother's request Pumping frequency: as needed Pumped volume:  (drops with stimulation)  Interventions Interventions: Breast feeding basics  reviewed;Skin to skin;Hand express;Breast massage;Hand pump;Expressed milk;Coconut oil;Education  Discharge Discharge Education: Engorgement and breast care;Warning signs for feeding baby Pump: Personal;DEBP;Manual WIC Program: No  Consult Status Consult Status: Complete Date: 05/07/23 Follow-up type: Call as needed    [name] 05/07/2023, 12:47 PM

## 2023-05-07 NOTE — Lactation Note (Signed)
This note was copied from a baby's chart. Lactation Consultation Note  Patient Name: [name] Today's Date: 05/07/2023 Age:35 hours   LC into room for parent request. Pediatrician is evaluating infant. LC will come back at a later time.   [name] [name] 05/07/2023, 11:57 AM

## 2023-05-07 NOTE — Discharge Summary (Signed)
Postpartum Discharge Summary    Patient Name: [name] DOB: [id number] MRN: [id number]  Date of admission: 05/05/2023 Delivery date:05/05/2023  Delivering provider: Carlisle Cater  Date of discharge: 05/07/2023  Admitting diagnosis: Chronic hypertension affecting pregnancy [O10.919], schedule repeat cesarean section Intrauterine pregnancy: [redacted]w[redacted]d     Secondary diagnosis:  Principal Problem:   Chronic hypertension affecting pregnancy  Additional problems: obesity    Discharge diagnosis: Anemia          - acute blood loss                                    Post partum procedures: none Augmentation: N/A Complications: None  Hospital course: Sceduled C/S   37 y.o. yo [id number] at [redacted]w[redacted]d was admitted to the hospital 05/05/2023 for scheduled cesarean section with the following indication:Elective Repeat.Delivery details are as follows:  Membrane Rupture Time/Date: 8:12 AM ,05/05/2023   Delivery Method:C-Section, Low Transverse  Details of operation can be found in separate operative note.  Patient had a postpartum course complicated bynothing.  She is ambulating, tolerating a regular diet, passing flatus, and urinating well. Patient is discharged home in stable condition on  05/07/23        Newborn Data: Birth date:05/05/2023  Birth time:8:13 AM  Gender:Female  Living status:Living  Apgars:9 ,9  Weight:2850 g     Magnesium Sulfate received: No BMZ received: No Rhophylac:N/A Transfusion:No  Physical exam  Vitals:   05/06/23 0557 05/06/23 1410 05/06/23 2032 05/07/23 0608  BP: 128/66 124/64 125/73 116/72  Pulse: (!) 58 74 69 78  Resp: 17 18 18 16   Temp: 97.9 F (36.6 C) 98.4 F (36.9 C) (!) 97.3 F (36.3 C) 97.7 F (36.5 C)  TempSrc: Oral Oral Oral Oral  SpO2: 97%  98% 97%  Weight:      Height:       General: alert, cooperative, and no distress Lochia: appropriate Uterine Fundus: firm Incision: Healing well with no significant drainage, No significant erythema,  Dressing is clean, dry, and intact DVT Evaluation: No evidence of DVT seen on physical exam. Labs: Lab Results  Component Value Date   WBC 8.3 05/06/2023   HGB 8.7 (L) 05/06/2023   HCT 27.4 (L) 05/06/2023   MCV 85.1 05/06/2023   PLT 195 05/06/2023      Latest Ref Rng & Units 07/06/2022    1:03 PM  CMP  Glucose 70 - 99 mg/dL 77   BUN 6 - 20 mg/dL <5   Creatinine 4.03 - 1.00 mg/dL 4.74   Sodium 259 - 563 mmol/L 136   Potassium 3.5 - 5.1 mmol/L 3.7   Chloride 98 - 111 mmol/L 108   CO2 22 - 32 mmol/L 23   Calcium 8.9 - 10.3 mg/dL 9.0    Edinburgh Score:    05/05/2023   11:00 AM  Edinburgh Postnatal Depression Scale Screening Tool  I have been able to laugh and see the funny side of things. 0  I have looked forward with enjoyment to things. 0  I have blamed myself unnecessarily when things went wrong. 0  I have been anxious or worried for no good reason. 0  I have felt scared or panicky for no good reason. 0  Things have been getting on top of me. 0  I have been so unhappy that I have had difficulty sleeping. 0  I have felt sad or miserable.  0  I have been so unhappy that I have been crying. 0  The thought of harming myself has occurred to me. 0  Edinburgh Postnatal Depression Scale Total 0      After visit meds:  Allergies as of 05/07/2023       Reactions   Bactrim [sulfamethoxazole-trimethoprim]    Chills, joint pain, sweating, headache   Topamax [topiramate] Rash        Medication List     STOP taking these medications    aspirin EC 81 MG tablet       TAKE these medications    docusate sodium 100 MG capsule Commonly known as: COLACE Take 100 mg by mouth in the morning.   ferrous sulfate 325 (65 FE) MG tablet Take 325 mg by mouth every other day. In the morning.   multivitamin-prenatal 27-0.8 MG Tabs tablet Take 1 tablet by mouth in the morning.   NIFEdipine 60 MG 24 hr tablet Commonly known as: PROCARDIA XL/NIFEDICAL XL Take 1 tablet (60 mg  total) by mouth 2 (two) times daily.   oxyCODONE 5 MG immediate release tablet Commonly known as: Oxy IR/ROXICODONE Take 1-2 tablets (5-10 mg total) by mouth every 4 (four) hours as needed for moderate pain.               Discharge Care Instructions  (From admission, onward)           Start     Ordered   05/07/23 0000  Discharge wound care:       Comments: Can remove dressing and steri strips 5 days from date of surgery   05/07/23 1135             Discharge home in stable condition Infant Disposition:home with mother Discharge instruction: per After Visit Summary and Postpartum booklet. Activity: Advance as tolerated. Pelvic rest for 6 weeks.  Diet: routine diet Anticipated Birth Control: Unsure Postpartum Appointment: with 5 days of discharge Additional Postpartum F/U: BP check as above Future Appointments: Future Appointments  Date Time Provider Department Center  05/20/2023  2:00 PM Tobb, Lavona Mound, DO [facility] None   Follow up Visit:  Follow-up Information     Shivaji, Valerie Roys, MD. Call.   Specialty: Obstetrics and Gynecology Why: Call office to schedule BP check within 5 days of discharge Contact information: [address] [place] [address] [phone]                     05/07/2023 Philip Aspen, DO

## 2023-05-07 NOTE — Progress Notes (Signed)
Patient is eating, ambulating, voiding.  Pain control is good. Appropriate lochia.  Having some challenges with breast feeding, working with lactation.  No other complaints.  Vitals:   05/06/23 0557 05/06/23 1410 05/06/23 2032 05/07/23 0608  BP: 128/66 124/64 125/73 116/72  Pulse: (!) 58 74 69 78  Resp: 17 18 18 16   Temp: 97.9 F (36.6 C) 98.4 F (36.9 C) (!) 97.3 F (36.3 C) 97.7 F (36.5 C)  TempSrc: Oral Oral Oral Oral  SpO2: 97%  98% 97%  Weight:      Height:        Fundus firm Inc: c/d/I Ext: no calf tenderness  Lab Results  Component Value Date   WBC 8.3 05/06/2023   HGB 8.7 (L) 05/06/2023   HCT 27.4 (L) 05/06/2023   MCV 85.1 05/06/2023   PLT 195 05/06/2023    --/--/O POS (06/21 5573)  A/P Post op day #2 s/p Repeat C/S with bilateral salpingectomy. CHTN: BPs wnl on Procardia XL 60- ordered for bid Breast feeding issues with infant weight loss, recommend continuing to work with lactation with possible d/c tomorrow. Acute blood loss anemia: cont. PNV with iron daily, can add additional daily iron every other day at discharge. Other routine care.  Routine care.  Expect d/c 6/27.    Philip Aspen

## 2023-05-20 ENCOUNTER — Encounter: Payer: Self-pay | Admitting: Cardiology

## 2023-05-20 ENCOUNTER — Ambulatory Visit: Payer: BC Managed Care – PPO | Attending: Cardiology | Admitting: Cardiology

## 2023-05-20 ENCOUNTER — Telehealth: Payer: Self-pay

## 2023-05-20 VITALS — BP 128/79 | HR 66 | Ht 64.0 in | Wt 222.0 lb

## 2023-05-20 DIAGNOSIS — E669 Obesity, unspecified: Secondary | ICD-10-CM | POA: Diagnosis not present

## 2023-05-20 DIAGNOSIS — I10 Essential (primary) hypertension: Secondary | ICD-10-CM | POA: Diagnosis not present

## 2023-05-20 NOTE — Progress Notes (Signed)
Cardio-Obstetrics Clinic  Follow Up Note   Date:  05/20/2023   ID:  [name], DOB 1986/01/15, MRN [id number]  PCP:  Irena Reichmann, DO   [facility] Providers Cardiologist:  Thomasene Ripple, DO  Electrophysiologist:  None        Referring MD: Irena Reichmann, DO   Chief Complaint: " I am doing   History of Present Illness:    [name] is a 37 y.o. female [[id number]] who returns for follow up of chronic hypertension her today to be for follow-up for postpartum cardiovascular visit.  She delivered at her daughter via C-section on May 05, 2023.  Baby Harlee is doing well.  The patient is also doing well.  She has 1 complaints.  She has had some help since she had the baby.  She is very happy about this.  Her blood pressure has been at target.   Prior CV Studies Reviewed: The following studies were reviewed today:   Past Medical History:  Diagnosis Date   COVID 10/2019   mild - lost taste and smell for 3 weeks   H/O seasonal allergies    Hypertension    Migraine    uses Botox    Past Surgical History:  Procedure Laterality Date   CESAREAN SECTION N/A 04/30/2013   Procedure: CESAREAN SECTION;  Surgeon: Kirkland Hun, MD;  Location: [facility];  Service: Obstetrics;  Laterality: N/A;   CESAREAN SECTION WITH BILATERAL TUBAL LIGATION N/A 05/05/2023   Procedure: CESAREAN SECTION WITH BILATERAL TUBAL LIGATION;  Surgeon: Carlisle Cater, MD;  Location: [facility];  Service: Obstetrics;  Laterality: N/A;   COLONOSCOPY     DILATION AND EVACUATION N/A 07/06/2022   Procedure: DILATATION AND EVACUATION WITH INTRAOPERATIVE ULTRASOUND, REPAIR OF LABIAL LACERATION;  Surgeon: Carlisle Cater, MD;  Location: [facility] OR;  Service: Gynecology;  Laterality: N/A;   UPPER GI ENDOSCOPY        OB History     Gravida  6   Para  2   Term  2   Preterm      AB  4   Living  2      SAB  3   IAB  1   Ectopic      Multiple  0   Live Births  2                Current Medications: Current Meds  Medication Sig   docusate sodium (COLACE) 100 MG capsule Take 100 mg by mouth in the morning.   ferrous sulfate 325 (65 FE) MG tablet Take 325 mg by mouth every other day. In the morning.   NIFEdipine (PROCARDIA XL/NIFEDICAL XL) 60 MG 24 hr tablet Take 1 tablet (60 mg total) by mouth 2 (two) times daily.   Prenatal Vit-Fe Fumarate-FA (MULTIVITAMIN-PRENATAL) 27-0.8 MG TABS tablet Take 1 tablet by mouth in the morning.     Allergies:   Bactrim [sulfamethoxazole-trimethoprim] and Topamax [topiramate]   Social History   Socioeconomic History   Marital status: Single    Spouse name: Not on file   Number of children: 1   Years of education: Not on file   Highest education level: Not on file  Occupational History   Occupation: human resources  Tobacco Use   Smoking status: Never   Smokeless tobacco: Never  Vaping Use   Vaping Use: Never used  Substance and Sexual Activity   Alcohol use: Not Currently    Comment: occ  Drug use: No   Sexual activity: Yes    Birth control/protection: None  Other Topics Concern   Not on file  Social History Narrative   Lives with her child   Right handed   Caffeine: 1-2 cups/day   Social Determinants of Health   Financial Resource Strain: Not on file  Food Insecurity: No Food Insecurity (05/05/2023)   Hunger Vital Sign    Worried About Running Out of Food in the Last Year: Never true    Ran Out of Food in the Last Year: Never true  Transportation Needs: Unmet Transportation Needs (05/05/2023)   PRAPARE - Administrator, Civil Service (Medical): Yes    Lack of Transportation (Non-Medical): Yes  Physical Activity: Not on file  Stress: Not on file  Social Connections: Not on file      Family History  Problem Relation Age of Onset   Healthy Mother    Migraines Mother    Prostate cancer Father    Healthy Son    Colon cancer Paternal Grandmother    Migraines Maternal Grandmother     Heart disease Neg Hx    Hypertension Neg Hx       ROS:   Please see the history of present illness.     All other systems reviewed and are negative.   Labs/EKG Reviewed:    EKG: Sinus rhythm, heart rate 83 bpm with nonspecific ST changes.  Recent Labs: 07/06/2022: BUN <5; Creatinine, Ser 0.69; Potassium 3.7; Sodium 136 05/06/2023: Hemoglobin 8.7; Platelets 195   Recent Lipid Panel No results found for: "CHOL", "TRIG", "HDL", "CHOLHDL", "LDLCALC", "LDLDIRECT"  Physical Exam:    VS:  BP 128/79   Pulse 66   Ht 5\' 4"  (1.626 m)   Wt 222 lb (100.7 kg)   LMP 08/12/2022   BMI 38.11 kg/m     Wt Readings from Last 3 Encounters:  05/20/23 222 lb (100.7 kg)  05/05/23 242 lb 9.6 oz (110 kg)  04/22/23 237 lb (107.5 kg)     GEN:  Well nourished, well developed in no acute distress HEENT: Normal NECK: No JVD; No carotid bruits LYMPHATICS: No lymphadenopathy CARDIAC: RRR, no murmurs, rubs, gallops RESPIRATORY:  Clear to auscultation without rales, wheezing or rhonchi  ABDOMEN: Soft, non-tender, non-distended MUSCULOSKELETAL:  No edema; No deformity  SKIN: Warm and dry NEUROLOGIC:  Alert and oriented x 3 PSYCHIATRIC:  Normal affect    Risk Assessment/Risk Calculators:                  ASSESSMENT & PLAN:    She is postpartum recovery from a C-section which was performed on May 05, 2023.  Her blood pressure is at target.  Will continue her nifedipine at current dose.  She also is breast-feeding.  No evidence of postpartum depression.  The patient is in agreement with the above plan. The patient left the office in stable condition.  The patient will follow up in 3 months or sooner if needed.   Patient Instructions  Medication Instructions:  Your physician recommends that you continue on your current medications as directed. Please refer to the Current Medication list given to you today.  *If you need a refill on your cardiac medications before your next appointment,  please call your pharmacy*   Lab Work: None   Testing/Procedures: None   Follow-Up: At [facility], you and your health needs are our priority.  As part of our continuing mission to provide you with exceptional  heart care, we have created designated Provider Care Teams.  These Care Teams include your primary Cardiologist (physician) and Advanced Practice Providers (APPs -  Physician Assistants and Nurse Practitioners) who all work together to provide you with the care you need, when you need it.  Your next appointment:   3 month(s)  Provider:   Thomasene Ripple, DO    Dispo:  No follow-ups on file.   Medication Adjustments/Labs and Tests Ordered: Current medicines are reviewed at length with the patient today.  Concerns regarding medicines are outlined above.  Tests Ordered: No orders of the defined types were placed in this encounter.  Medication Changes: No orders of the defined types were placed in this encounter.

## 2023-05-20 NOTE — Telephone Encounter (Signed)
Called pt to set up her 3 month f/u visit. No answer, left a message for pt to call back or send a MyChart message.  Pt should be seen the week of Oct 14th.

## 2023-05-20 NOTE — Patient Instructions (Signed)
Medication Instructions:  Your physician recommends that you continue on your current medications as directed. Please refer to the Current Medication list given to you today.  *If you need a refill on your cardiac medications before your next appointment, please call your pharmacy*   Lab Work: None   Testing/Procedures: None   Follow-Up: At [facility], you and your health needs are our priority.  As part of our continuing mission to provide you with exceptional heart care, we have created designated Provider Care Teams.  These Care Teams include your primary Cardiologist (physician) and Advanced Practice Providers (APPs -  Physician Assistants and Nurse Practitioners) who all work together to provide you with the care you need, when you need it.   Your next appointment:   3 month(s)  Provider:   Kardie Tobb, DO   

## 2023-05-20 NOTE — Telephone Encounter (Signed)
Earlier. She needs an appt. Oct 18th is fine. Can you schedule it?

## 2023-05-22 ENCOUNTER — Encounter (HOSPITAL_COMMUNITY): Payer: Self-pay | Admitting: Obstetrics and Gynecology

## 2023-05-22 ENCOUNTER — Inpatient Hospital Stay (HOSPITAL_COMMUNITY)
Admission: AD | Admit: 2023-05-22 | Discharge: 2023-05-22 | Disposition: A | Discharge status: Home / Self Care | Payer: BC Managed Care – PPO | Attending: Obstetrics and Gynecology | Admitting: Obstetrics and Gynecology

## 2023-05-22 DIAGNOSIS — T8141XA Infection following a procedure, superficial incisional surgical site, initial encounter: Secondary | ICD-10-CM | POA: Diagnosis not present

## 2023-05-22 DIAGNOSIS — O8689 Other specified puerperal infections: Secondary | ICD-10-CM | POA: Diagnosis not present

## 2023-05-22 DIAGNOSIS — T8149XA Infection following a procedure, other surgical site, initial encounter: Secondary | ICD-10-CM | POA: Diagnosis not present

## 2023-05-22 DIAGNOSIS — X58XXXA Exposure to other specified factors, initial encounter: Secondary | ICD-10-CM | POA: Insufficient documentation

## 2023-05-22 MED ORDER — CLINDAMYCIN PHOSPHATE 900 MG/50ML IV SOLN
900.0000 mg | Freq: Once | INTRAVENOUS | Status: AC
  Administered 2023-05-22: 900 mg via INTRAVENOUS
  Filled 2023-05-22: qty 50

## 2023-05-22 MED ORDER — CLINDAMYCIN HCL 300 MG PO CAPS: 300.0000 mg | ORAL_CAPSULE | Freq: Three times a day (TID) | ORAL | 0 refills | Status: AC

## 2023-05-22 NOTE — MAU Provider Note (Signed)
History     CSN: [id number]  Arrival date and time: 05/22/23 1537   None     Chief Complaint  Patient presents with   Post-op Problem   HPI [name] is a 37 y.o. [id number] who is PPD# 37 s/p rLTCS who presents to MAU for incision check. She reports she recently completed a course of antibiotics on Monday for an incisional infection. She reports a "rotting, dead smell", redness, and pain on the left side of her incision. She also reports that her incision does appear to be slightly open. She has tried to keep the area dry and clean as much as possible, going as far as using a handheld fan. She denies fever, chills, or heat to the area. No abdominal pain, vaginal bleeding or vaginal discharge/odor. She reports a history of an incisional infection with her first c-section as well.   OB History     Gravida  6   Para  2   Term  2   Preterm      AB  4   Living  2      SAB  3   IAB  1   Ectopic      Multiple  0   Live Births  2           Past Medical History:  Diagnosis Date   COVID 10/2019   mild - lost taste and smell for 3 weeks   H/O seasonal allergies    Hypertension    Migraine    uses Botox    Past Surgical History:  Procedure Laterality Date   CESAREAN SECTION N/A 04/30/2013   Procedure: CESAREAN SECTION;  Surgeon: Kirkland Hun, MD;  Location: [facility];  Service: Obstetrics;  Laterality: N/A;   CESAREAN SECTION WITH BILATERAL TUBAL LIGATION N/A 05/05/2023   Procedure: CESAREAN SECTION WITH BILATERAL TUBAL LIGATION;  Surgeon: Carlisle Cater, MD;  Location: [facility];  Service: Obstetrics;  Laterality: N/A;   COLONOSCOPY     DILATION AND EVACUATION N/A 07/06/2022   Procedure: DILATATION AND EVACUATION WITH INTRAOPERATIVE ULTRASOUND, REPAIR OF LABIAL LACERATION;  Surgeon: Carlisle Cater, MD;  Location: [facility] OR;  Service: Gynecology;  Laterality: N/A;   UPPER GI ENDOSCOPY      Family History  Problem Relation Age of Onset   Healthy Mother     Migraines Mother    Prostate cancer Father    Healthy Son    Colon cancer Paternal Grandmother    Migraines Maternal Grandmother    Heart disease Neg Hx    Hypertension Neg Hx     Social History   Tobacco Use   Smoking status: Never   Smokeless tobacco: Never  Vaping Use   Vaping status: Never Used  Substance Use Topics   Alcohol use: Not Currently    Comment: occ   Drug use: No    Allergies:  Allergies  Allergen Reactions   Bactrim [Sulfamethoxazole-Trimethoprim]     Chills, joint pain, sweating, headache   Topamax [Topiramate] Rash    Medications Prior to Admission  Medication Sig Dispense Refill Last Dose   docusate sodium (COLACE) 100 MG capsule Take 100 mg by mouth in the morning.   05/22/2023   ferrous sulfate 325 (65 FE) MG tablet Take 325 mg by mouth every other day. In the morning.   05/22/2023   NIFEdipine (PROCARDIA XL/NIFEDICAL XL) 60 MG 24 hr tablet Take 1 tablet (60 mg total) by mouth 2 (two) times daily.  180 tablet 3 05/22/2023   Prenatal Vit-Fe Fumarate-FA (MULTIVITAMIN-PRENATAL) 27-0.8 MG TABS tablet Take 1 tablet by mouth in the morning.   05/22/2023   oxyCODONE (OXY IR/ROXICODONE) 5 MG immediate release tablet Take 1-2 tablets (5-10 mg total) by mouth every 4 (four) hours as needed for moderate pain. (Patient not taking: Reported on 05/20/2023) 30 tablet 0    Review of Systems  Skin:        Incisional pain, odor, and redness  All other systems reviewed and are negative.  Physical Exam   Blood pressure 128/85, pulse (!) 57, temperature 98.3 F (36.8 C), resp. rate 17, height 5\' 4"  (1.626 m), weight 101.5 kg, SpO2 99%, currently breastfeeding.  Physical Exam Vitals and nursing note reviewed.  Constitutional:      General: She is not in acute distress. Eyes:     Extraocular Movements: Extraocular movements intact.     Pupils: Pupils are equal, round, and reactive to light.  Cardiovascular:     Rate and Rhythm: Bradycardia present.  Pulmonary:      Effort: Pulmonary effort is normal. No respiratory distress.  Abdominal:     Palpations: Abdomen is soft.     Comments: Incision well-approximated, small superficial opening mid incision with some granulation tissue, no funneling, mild tenderness at incision, no drainage or bleeding, erythema surrounding incision but no demarcation, foul odor present   Musculoskeletal:        General: Normal range of motion.     Cervical back: Normal range of motion.  Skin:    General: Skin is warm and dry.  Neurological:     General: No focal deficit present.     Mental Status: She is alert and oriented to person, place, and time.  Psychiatric:        Mood and Affect: Mood normal.        Behavior: Behavior normal.    MAU Course  Procedures  MDM  Patient afebrile, vital signs stable. Mildly tender at incision, some erythema and odor noted.   Dr. Reina Fuse at bedside to assess patient. Will give 1 dose of IV Clindamycin here. Provena was placed by Dr. Reina Fuse.    Assessment and Plan   1. Surgical site infection    - Discharge home in stable condition - Rx for Clindamycin sent to pharmacy - Return precautions given. Return to MAU as needed - Follow up at [facility] as scheduled  Brand Males, CNM 05/22/2023, 8:46 PM

## 2023-05-22 NOTE — MAU Note (Signed)
[name] is a 37 y.o. at Unknown here in MAU reporting: c/s 06/24.  Incision has foul odor, been a while, was given a course of antibiotics, finished on Monday. Hurts on left side. That area has not completely closed.  Has been into office twice. Denies fever Onset of complaint: over a wk Pain score: 6 Vitals:   05/22/23 1628  BP: 124/76  Pulse: (!) 58  Resp: 17  Temp: 98.3 F (36.8 C)  SpO2: 99%     Lab orders placed from triage:

## 2023-05-22 NOTE — MAU Note (Signed)
Antibiotic completed-tolerated without complaint

## 2023-05-22 NOTE — Progress Notes (Signed)
Written and verbal d/c instructions given and understanding voiced. 

## 2023-05-24 ENCOUNTER — Inpatient Hospital Stay (HOSPITAL_COMMUNITY)
Admission: AD | Admit: 2023-05-24 | Discharge: 2023-05-24 | Disposition: A | Discharge status: Home / Self Care | Payer: BC Managed Care – PPO | Attending: Obstetrics and Gynecology | Admitting: Obstetrics and Gynecology

## 2023-05-24 ENCOUNTER — Other Ambulatory Visit: Payer: Self-pay

## 2023-05-24 DIAGNOSIS — Z4689 Encounter for fitting and adjustment of other specified devices: Secondary | ICD-10-CM | POA: Diagnosis not present

## 2023-05-24 DIAGNOSIS — Z98891 History of uterine scar from previous surgery: Secondary | ICD-10-CM

## 2023-05-24 DIAGNOSIS — L304 Erythema intertrigo: Secondary | ICD-10-CM | POA: Diagnosis not present

## 2023-05-24 DIAGNOSIS — Z8616 Personal history of COVID-19: Secondary | ICD-10-CM | POA: Diagnosis not present

## 2023-05-24 MED ORDER — NYSTATIN 100000 UNIT/GM EX POWD: 1.0000 | Freq: Three times a day (TID) | CUTANEOUS | 0 refills | Status: DC

## 2023-05-24 NOTE — MAU Provider Note (Addendum)
History     CSN: [id number]  Arrival date and time: 05/24/23 1443   Event Date/Time   First Provider Initiated Contact with Patient 05/24/23 1612      Chief Complaint  Patient presents with   Incision   AMILY CARRAGHER is a 37 y.o. [id number] who is s/p rLTCS from 6/23. She was seen in the MAU 2 days prior for a wound dehiscence and a wound vac was placed. She is here today because the seal has broken on her dressing. She is taking antibiotics by mouth and denies fever.     OB History     Gravida  6   Para  2   Term  2   Preterm      AB  4   Living  2      SAB  3   IAB  1   Ectopic      Multiple  0   Live Births  2           Past Medical History:  Diagnosis Date   COVID 10/2019   mild - lost taste and smell for 3 weeks   H/O seasonal allergies    Hypertension    Migraine    uses Botox    Past Surgical History:  Procedure Laterality Date   CESAREAN SECTION N/A 04/30/2013   Procedure: CESAREAN SECTION;  Surgeon: Kirkland Hun, MD;  Location: [facility];  Service: Obstetrics;  Laterality: N/A;   CESAREAN SECTION WITH BILATERAL TUBAL LIGATION N/A 05/05/2023   Procedure: CESAREAN SECTION WITH BILATERAL TUBAL LIGATION;  Surgeon: Carlisle Cater, MD;  Location: [facility];  Service: Obstetrics;  Laterality: N/A;   COLONOSCOPY     DILATION AND EVACUATION N/A 07/06/2022   Procedure: DILATATION AND EVACUATION WITH INTRAOPERATIVE ULTRASOUND, REPAIR OF LABIAL LACERATION;  Surgeon: Carlisle Cater, MD;  Location: [facility];  Service: Gynecology;  Laterality: N/A;   UPPER GI ENDOSCOPY      Family History  Problem Relation Age of Onset   Healthy Mother    Migraines Mother    Prostate cancer Father    Healthy Son    Colon cancer Paternal Grandmother    Migraines Maternal Grandmother    Heart disease Neg Hx    Hypertension Neg Hx     Social History   Tobacco Use   Smoking status: Never   Smokeless tobacco: Never  Vaping Use   Vaping status: Never Used   Substance Use Topics   Alcohol use: Not Currently    Comment: occ   Drug use: No    Allergies:  Allergies  Allergen Reactions   Bactrim [Sulfamethoxazole-Trimethoprim]     Chills, joint pain, sweating, headache   Topamax [Topiramate] Rash    Medications Prior to Admission  Medication Sig Dispense Refill Last Dose   clindamycin (CLEOCIN) 300 MG capsule Take 1 capsule (300 mg total) by mouth 3 (three) times daily for 7 days. 21 capsule 0 05/24/2023   docusate sodium (COLACE) 100 MG capsule Take 100 mg by mouth in the morning.   05/24/2023   ferrous sulfate 325 (65 FE) MG tablet Take 325 mg by mouth every other day. In the morning.   05/23/2023   NIFEdipine (PROCARDIA XL/NIFEDICAL XL) 60 MG 24 hr tablet Take 1 tablet (60 mg total) by mouth 2 (two) times daily. 180 tablet 3 05/24/2023   Prenatal Vit-Fe Fumarate-FA (MULTIVITAMIN-PRENATAL) 27-0.8 MG TABS tablet Take 1 tablet by mouth in the morning.   05/24/2023  oxyCODONE (OXY IR/ROXICODONE) 5 MG immediate release tablet Take 1-2 tablets (5-10 mg total) by mouth every 4 (four) hours as needed for moderate pain. (Patient not taking: Reported on 05/20/2023) 30 tablet 0     Review of Systems  Constitutional:  Negative for activity change and fever.  Gastrointestinal:  Positive for abdominal pain.  Genitourinary:  Negative for difficulty urinating.  Skin:  Positive for rash and wound.   Physical Exam   Blood pressure (!) 135/93, pulse (!) 55, temperature 98 F (36.7 C), temperature source Oral, resp. rate 16, height 5\' 4"  (1.626 m), weight 101 kg, SpO2 100%, currently breastfeeding.  Physical Exam Constitutional:      General: She is not in acute distress.    Appearance: She is not ill-appearing, toxic-appearing or diaphoretic.  Cardiovascular:     Rate and Rhythm: Normal rate.  Pulmonary:     Effort: Pulmonary effort is normal.  Abdominal:     Palpations: Abdomen is soft. There is no mass.     Tenderness: There is abdominal  tenderness. There is no guarding or rebound.  Skin:    Findings: Rash present.     Comments: No evidence of dehiscence. Yellow film over wound with mild erythema and odor. Intertrigo of the inguinal area  Neurological:     Mental Status: She is alert and oriented to person, place, and time.  Psychiatric:        Mood and Affect: Mood normal.        Behavior: Behavior normal.     MAU Course  Procedures  MDM   Assessment and Plan  1. Encounter for management of wound VAC Evidence of healing. Cleaned and no continued odor or drainage. Dry at time of dressing placing -Prevena dressing replaced by OR techs Efraim Kaufmann, Jessica) and attached to vacuum with a good seal.  -Continue PO antibiotics -Follow up for wound check Wednesday  2. Postpartum exam  3. S/P primary low transverse C-section  4. Intertrigo -Nystatin powder   Inocencio Roy Autry-Lott 05/24/2023, 4:12 PM

## 2023-05-24 NOTE — MAU Note (Signed)
Patient reports C/S o 6/24, has been seen in office 7/2 and 7/8 and MAU on 7/11. Wound vac placed 7/11 following dehisence and infection with Abx tx. States that wound vac stopped working last night - per indicator on vac and that adhesive was no longer adhering to skin at lower edge. Reports a foul odor from incision site.

## 2023-05-24 NOTE — MAU Note (Signed)
[name] is a 37 y.o. at Unknown here in MAU reporting: she had a wound vac applied to incision on Thursday and adhesive not sticking to skin.  Reports she is currently on antibiotics because wound is infected LMP: NA Onset of complaint: last night Pain score: 5 Vitals:   05/24/23 1529  BP: 125/85  Pulse: 74  Resp: 18  Temp: (!) 97.3 F (36.3 C)  SpO2: 100%     FHT:NA Lab orders placed from triage:   None

## 2023-05-27 ENCOUNTER — Telehealth (HOSPITAL_COMMUNITY): Payer: Self-pay | Admitting: *Deleted

## 2023-05-27 NOTE — Telephone Encounter (Signed)
05/27/2023  Name: [name] MRN: [id number] DOB: [birth year]  Reason for Call:  Transition of Care Hospital Discharge Call  Contact Status: Patient Contact Status: Complete Call was disconnected. RN called back and was forwarded to voicemail. Language assistant needed:          Follow-Up Questions: Do You Have Any Concerns About Your Health As You Heal From Delivery?: No Do You Have Any Concerns About Your Infants Health?: No  Edinburgh Postnatal Depression Scale:  In the Past 7 Days:   Unable to complete EPDS as call was disconnected. PHQ2-9 Depression Scale:     Discharge Follow-up: Edinburgh score requires follow up?: N/A  Post-discharge interventions: Reviewed Newborn Safe Sleep Practices  Signature Deforest Hoyles, RN, [phone]

## 2023-05-28 DIAGNOSIS — T8141XD Infection following a procedure, superficial incisional surgical site, subsequent encounter: Secondary | ICD-10-CM | POA: Diagnosis not present

## 2023-05-28 DIAGNOSIS — Z4889 Encounter for other specified surgical aftercare: Secondary | ICD-10-CM | POA: Diagnosis not present

## 2023-06-01 ENCOUNTER — Other Ambulatory Visit: Payer: Self-pay | Admitting: Obstetrics and Gynecology

## 2023-06-01 DIAGNOSIS — T8141XA Infection following a procedure, superficial incisional surgical site, initial encounter: Secondary | ICD-10-CM

## 2023-06-02 DIAGNOSIS — D2222 Melanocytic nevi of left ear and external auricular canal: Secondary | ICD-10-CM | POA: Diagnosis not present

## 2023-06-02 DIAGNOSIS — L2089 Other atopic dermatitis: Secondary | ICD-10-CM | POA: Diagnosis not present

## 2023-06-02 DIAGNOSIS — D485 Neoplasm of uncertain behavior of skin: Secondary | ICD-10-CM | POA: Diagnosis not present

## 2023-06-12 DIAGNOSIS — T148XXA Other injury of unspecified body region, initial encounter: Secondary | ICD-10-CM | POA: Diagnosis not present

## 2023-06-12 DIAGNOSIS — R7309 Other abnormal glucose: Secondary | ICD-10-CM | POA: Diagnosis not present

## 2023-06-12 DIAGNOSIS — Z13220 Encounter for screening for lipoid disorders: Secondary | ICD-10-CM | POA: Diagnosis not present

## 2023-06-12 DIAGNOSIS — Z79899 Other long term (current) drug therapy: Secondary | ICD-10-CM | POA: Diagnosis not present

## 2023-06-18 ENCOUNTER — Other Ambulatory Visit (HOSPITAL_COMMUNITY): Payer: Self-pay | Admitting: Obstetrics and Gynecology

## 2023-06-18 ENCOUNTER — Ambulatory Visit (HOSPITAL_COMMUNITY)
Admission: RE | Admit: 2023-06-18 | Discharge: 2023-06-18 | Disposition: A | Discharge status: Home / Self Care | Payer: BC Managed Care – PPO | Source: Ambulatory Visit | Attending: Obstetrics and Gynecology | Admitting: Obstetrics and Gynecology

## 2023-06-18 DIAGNOSIS — M7989 Other specified soft tissue disorders: Secondary | ICD-10-CM

## 2023-06-19 ENCOUNTER — Ambulatory Visit: Payer: BC Managed Care – PPO | Admitting: Physician Assistant

## 2023-06-29 ENCOUNTER — Telehealth: Payer: BC Managed Care – PPO | Admitting: Nurse Practitioner

## 2023-06-29 DIAGNOSIS — N760 Acute vaginitis: Secondary | ICD-10-CM

## 2023-06-29 DIAGNOSIS — B9689 Other specified bacterial agents as the cause of diseases classified elsewhere: Secondary | ICD-10-CM | POA: Diagnosis not present

## 2023-06-29 MED ORDER — METRONIDAZOLE 0.75 % VA GEL
VAGINAL | 0 refills | Status: DC
Start: 2023-06-29 — End: 2023-09-05

## 2023-06-29 NOTE — Progress Notes (Signed)

## 2023-06-29 NOTE — Progress Notes (Signed)
I have spent 5 minutes in review of e-visit questionnaire, review and updating patient chart, medical decision making and response to patient.  ° °Zelda W Fleming, NP ° °  °

## 2023-07-15 ENCOUNTER — Other Ambulatory Visit: Payer: Self-pay | Admitting: Nurse Practitioner

## 2023-07-15 DIAGNOSIS — N760 Acute vaginitis: Secondary | ICD-10-CM

## 2023-08-05 DIAGNOSIS — M25531 Pain in right wrist: Secondary | ICD-10-CM | POA: Diagnosis not present

## 2023-08-13 DIAGNOSIS — A499 Bacterial infection, unspecified: Secondary | ICD-10-CM | POA: Diagnosis not present

## 2023-08-13 DIAGNOSIS — N760 Acute vaginitis: Secondary | ICD-10-CM | POA: Diagnosis not present

## 2023-08-13 DIAGNOSIS — N898 Other specified noninflammatory disorders of vagina: Secondary | ICD-10-CM | POA: Diagnosis not present

## 2023-08-29 ENCOUNTER — Telehealth: Payer: Self-pay | Admitting: Cardiology

## 2023-08-29 ENCOUNTER — Ambulatory Visit: Payer: BC Managed Care – PPO | Admitting: Cardiology

## 2023-08-29 NOTE — Telephone Encounter (Signed)
Pt states she would like to speak with a nurse about adjusting a medication. Please advise

## 2023-08-29 NOTE — Telephone Encounter (Signed)
Pt returning nurses all. Please advise

## 2023-08-29 NOTE — Telephone Encounter (Signed)
Spoke to patient she stated she was unable to keep appointment with Dr.Tobb this morning.She would like visit changed to a video visit.Spoke to Dr.Tobb's RN.She spoke to Dr.Tobb advised to add patient to 10/22 schedule.Patient stated she has a job interview that day 11:00 am to 4:00 pm.She requested am appointment.Video visit scheduled 10/22 at 9:30 am.

## 2023-08-29 NOTE — Telephone Encounter (Signed)
Left message to return call 

## 2023-09-02 ENCOUNTER — Encounter: Payer: Self-pay | Admitting: Cardiology

## 2023-09-02 ENCOUNTER — Ambulatory Visit: Payer: BC Managed Care – PPO | Attending: Cardiology | Admitting: Cardiology

## 2023-09-02 VITALS — BP 110/68 | HR 93 | Ht 64.0 in | Wt 208.0 lb

## 2023-09-02 DIAGNOSIS — I10 Essential (primary) hypertension: Secondary | ICD-10-CM

## 2023-09-02 DIAGNOSIS — O165 Unspecified maternal hypertension, complicating the puerperium: Secondary | ICD-10-CM

## 2023-09-02 NOTE — Progress Notes (Signed)
Cardio-Obstetrics Clinic  Follow Up Note   Date:  09/02/2023   ID:  Pamela, Nelson 10-05-1986, MRN [id number]  PCP:  Irena Reichmann, DO   [facility] Providers Cardiologist:  Thomasene Ripple, DO  Electrophysiologist:  None        Referring MD: Irena Reichmann, DO   Chief Complaint: " I am doing   She is at home. I am in the office.  Virtual Visit via Video  Note . I connected with the patient today by a   video enabled telemedicine application and verified that I am speaking with the correct person using two identifiers.   History of Present Illness:    [name] is a 37 y.o. female [[id number]] who returns for follow up of chronic hypertension her today to be for follow-up for postpartum cardiovascular visit.  She delivered at her daughter via C-section on May 05, 2023.  Baby Harlee is doing well.    Her last visit on May 20, 2023 which was also virtual she is doing well from a cardiovascular standpoint.  Prior CV Studies Reviewed: The following studies were reviewed today:   Past Medical History:  Diagnosis Date   COVID 10/2019   mild - lost taste and smell for 3 weeks   H/O seasonal allergies    Hypertension    Migraine    uses Botox    Past Surgical History:  Procedure Laterality Date   CESAREAN SECTION N/A 04/30/2013   Procedure: CESAREAN SECTION;  Surgeon: Kirkland Hun, MD;  Location: [facility];  Service: Obstetrics;  Laterality: N/A;   CESAREAN SECTION WITH BILATERAL TUBAL LIGATION N/A 05/05/2023   Procedure: CESAREAN SECTION WITH BILATERAL TUBAL LIGATION;  Surgeon: Carlisle Cater, MD;  Location: [facility];  Service: Obstetrics;  Laterality: N/A;   COLONOSCOPY     DILATION AND EVACUATION N/A 07/06/2022   Procedure: DILATATION AND EVACUATION WITH INTRAOPERATIVE ULTRASOUND, REPAIR OF LABIAL LACERATION;  Surgeon: Carlisle Cater, MD;  Location: [facility] OR;  Service: Gynecology;  Laterality: N/A;   UPPER GI ENDOSCOPY        OB History      Gravida  6   Para  2   Term  2   Preterm      AB  4   Living  2      SAB  3   IAB  1   Ectopic      Multiple  0   Live Births  2               Current Medications: Current Meds  Medication Sig   NIFEdipine (PROCARDIA XL/NIFEDICAL XL) 60 MG 24 hr tablet Take 1 tablet (60 mg total) by mouth 2 (two) times daily.   Prenatal Vit-Fe Fumarate-FA (MULTIVITAMIN-PRENATAL) 27-0.8 MG TABS tablet Take 1 tablet by mouth in the morning.     Allergies:   Bactrim [sulfamethoxazole-trimethoprim] and Topamax [topiramate]   Social History   Socioeconomic History   Marital status: Single    Spouse name: Not on file   Number of children: 1   Years of education: Not on file   Highest education level: Not on file  Occupational History   Occupation: human resources  Tobacco Use   Smoking status: Never   Smokeless tobacco: Never  Vaping Use   Vaping status: Never Used  Substance and Sexual Activity   Alcohol use: Not Currently    Comment: occ   Drug use: No   Sexual activity:  Yes    Birth control/protection: None  Other Topics Concern   Not on file  Social History Narrative   Lives with her child   Right handed   Caffeine: 1-2 cups/day   Social Determinants of Health   Financial Resource Strain: Low Risk  (02/07/2022)   Received from [facility], [facility]   Overall Financial Resource Strain (CARDIA)    Difficulty of Paying Living Expenses: Not hard at all  Food Insecurity: No Food Insecurity (05/05/2023)   Hunger Vital Sign    Worried About Running Out of Food in the Last Year: Never true    Ran Out of Food in the Last Year: Never true  Transportation Needs: Unmet Transportation Needs (05/05/2023)   PRAPARE - Transportation    Lack of Transportation (Medical): Yes    Lack of Transportation (Non-Medical): Yes  Physical Activity: Unknown (02/07/2022)   Received from [facility], [facility]   Exercise Vital Sign    Days of Exercise per Week: Patient  declined    Minutes of Exercise per Session: Not on file  Stress: No Stress Concern Present (02/07/2022)   Received from [facility] of Occupational Health - Occupational Stress Questionnaire    Feeling of Stress : Not at all  Social Connections: Unknown (02/23/2023)   Received from [facility], [facility]   Social Network    Social Network: Not on file      Family History  Problem Relation Age of Onset   Healthy Mother    Migraines Mother    Prostate cancer Father    Healthy Son    Colon cancer Paternal Grandmother    Migraines Maternal Grandmother    Heart disease Neg Hx    Hypertension Neg Hx       ROS:   Please see the history of present illness.     All other systems reviewed and are negative.   Labs/EKG Reviewed:    EKG: Sinus rhythm, heart rate 83 bpm with nonspecific ST changes.  Recent Labs: 05/06/2023: Hemoglobin 8.7; Platelets 195   Recent Lipid Panel No results found for: "CHOL", "TRIG", "HDL", "CHOLHDL", "LDLCALC", "LDLDIRECT"  Physical Exam:    VS:  BP 110/68 (BP Location: Left Arm, Patient Position: Sitting, Cuff Size: Normal)   Pulse 93   Ht 5\' 4"  (1.626 m)   Wt 208 lb (94.3 kg)   BMI 35.70 kg/m     Wt Readings from Last 3 Encounters:  09/02/23 208 lb (94.3 kg)  05/24/23 222 lb 11.2 oz (101 kg)  05/22/23 223 lb 12.8 oz (101.5 kg)     GEN:  Well nourished, well developed in no acute distress HEENT: Normal NECK: No JVD; No carotid bruits LYMPHATICS: No lymphadenopathy CARDIAC: RRR, no murmurs, rubs, gallops RESPIRATORY:  Clear to auscultation without rales, wheezing or rhonchi  ABDOMEN: Soft, non-tender, non-distended MUSCULOSKELETAL:  No edema; No deformity  SKIN: Warm and dry NEUROLOGIC:  Alert and oriented x 3 PSYCHIATRIC:  Normal affect    Risk Assessment/Risk Calculators:                  ASSESSMENT & PLAN:    Her blood pressure is at target.  Will continue her nifedipine at  current dose.  She also is breast-feeding.    The patient is in agreement with the above plan. The patient left the office in stable condition.  The patient will follow up in 6 months or sooner if needed for in  person visit.  Total time spent 10 minutes   Patient Instructions  Medication Instructions:  NO CHANGES   Lab Work: NONE    Testing/Procedures: NONE   Follow-Up: At [facility], you and your health needs are our priority.  As part of our continuing mission to provide you with exceptional heart care, we have created designated Provider Care Teams.  These Care Teams include your primary Cardiologist (physician) and Advanced Practice Providers (APPs -  Physician Assistants and Nurse Practitioners) who all work together to provide you with the care you need, when you need it.   Your next appointment:   6 month(s)  Provider:   Thomasene Ripple, DO       Dispo:  Return in about 6 months (around 03/02/2024).   Medication Adjustments/Labs and Tests Ordered: Current medicines are reviewed at length with the patient today.  Concerns regarding medicines are outlined above.  Tests Ordered: No orders of the defined types were placed in this encounter.  Medication Changes: No orders of the defined types were placed in this encounter.

## 2023-09-02 NOTE — Patient Instructions (Signed)
Medication Instructions:  NO CHANGES   Lab Work: NONE    Testing/Procedures: NONE   Follow-Up: At [facility], you and your health needs are our priority.  As part of our continuing mission to provide you with exceptional heart care, we have created designated Provider Care Teams.  These Care Teams include your primary Cardiologist (physician) and Advanced Practice Providers (APPs -  Physician Assistants and Nurse Practitioners) who all work together to provide you with the care you need, when you need it.   Your next appointment:   6 month(s)  Provider:   Thomasene Ripple, DO

## 2023-09-05 ENCOUNTER — Telehealth: Payer: BC Managed Care – PPO | Admitting: Physician Assistant

## 2023-09-05 DIAGNOSIS — J069 Acute upper respiratory infection, unspecified: Secondary | ICD-10-CM

## 2023-09-05 MED ORDER — IBUPROFEN 600 MG PO TABS
600.0000 mg | ORAL_TABLET | Freq: Three times a day (TID) | ORAL | 0 refills | Status: DC | PRN
Start: 2023-09-05 — End: 2023-12-16

## 2023-09-05 MED ORDER — FLUTICASONE PROPIONATE 50 MCG/ACT NA SUSP: 2.0000 | Freq: Every day | NASAL | 0 refills | Status: DC

## 2023-09-05 NOTE — Progress Notes (Signed)
E-Visit for [employer] Virus / COVID Screening  Your current symptoms could be consistent with COVID.  Please complete a Covid test either at home or check with your local pharmacy to see if they provide testing.    You have tested positive for COVID-19, meaning that you were infected with the novel coronavirus and could give the virus to others.  Most people with COVID-19 have mild illness and can recover at home without medical care. Do not leave your home, except to get medical care. Do not visit public areas and do not go to places where you are unable to wear a mask. It is important that you stay home  to take care for yourself and to help protect other people in your home and community.      Isolation Instructions:   You are to isolate at home until you have been fever free for at least 24 hours without a fever-reducing medication, and symptoms have been steadily improving for 24 hours. At that time,  you can end isolation but need to mask for an additional 5 days.  If you must be around other household members who do not have symptoms, you need to make sure that both you and the family members are masking consistently with a high-quality mask.  If you note any worsening of symptoms despite treatment, please seek an in-person evaluation ASAP. If you note any significant shortness of breath or any chest pain, please seek ER evaluation. Please do not delay care!  Go to the nearest hospital ED for assessment if fever/cough/breathlessness are severe or illness seems like a threat to life.    The following symptoms may appear 2-14 days after exposure: Fever Cough Shortness of breath or difficulty breathing Chills Repeated shaking with chills Muscle pain Headache Sore throat New loss of taste or smell Fatigue Congestion or runny nose Nausea or vomiting Diarrhea  You can use medication such as prescription anti-inflammatory called Ibuprofen 600 mg. Take twice daily as needed for fever or  body aches for 2 weeks and prescription for Fluticasone nasal spray 2 sprays in each nostril one time per day OTC Mucinex (plain) or Robitussin is best for cough while breastfeeding.   You may also take acetaminophen (Tylenol) as needed for fever.  HOME CARE Only take medications as instructed by your medical team. Drink plenty of fluids and get plenty of rest. A steam or ultrasonic humidifier can help if you have congestion.  GET HELP RIGHT AWAY IF YOU HAVE EMERGENCY WARNING SIGNS.  Call 911 or proceed to your closest emergency facility if: You develop worsening high fever. Trouble breathing Bluish lips or face Persistent pain or pressure in the chest New confusion Inability to wake or stay awake You cough up blood. Your symptoms become more severe Inability to hold down food or fluids  This list is not all possible symptoms. Contact your medical provider for any symptoms that are severe or concerning to you.   Your e-visit answers were reviewed by a board certified advanced clinical practitioner to complete your personal care plan.  Depending on the condition, your plan could have included both over the counter or prescription medications.  If there is a problem, please reply once you have received a response from your provider.  Your safety is important to [place].  If you have drug allergies check your prescription carefully.    You can use MyChart to ask questions about today's visit, request a non-urgent call back, or ask for a work  or school excuse for 24 hours related to this e-Visit. If it has been greater than 24 hours you will need to follow up with your provider or enter a new e-Visit to address those concerns. You will get an e-mail in the next two days asking about your experience.  I hope that your e-visit has been valuable and will speed your recovery. Thank you for using e-visits.    I have spent 5 minutes in review of e-visit questionnaire, review and updating patient  chart, medical decision making and response to patient.   Margaretann Loveless, PA-C

## 2023-11-14 DIAGNOSIS — L293 Anogenital pruritus, unspecified: Secondary | ICD-10-CM | POA: Diagnosis not present

## 2023-11-14 DIAGNOSIS — N760 Acute vaginitis: Secondary | ICD-10-CM | POA: Diagnosis not present

## 2023-11-21 ENCOUNTER — Ambulatory Visit: Payer: BC Managed Care – PPO | Admitting: Cardiology

## 2023-11-24 DIAGNOSIS — I10 Essential (primary) hypertension: Secondary | ICD-10-CM | POA: Diagnosis not present

## 2023-12-11 DIAGNOSIS — G5601 Carpal tunnel syndrome, right upper limb: Secondary | ICD-10-CM | POA: Diagnosis not present

## 2023-12-16 ENCOUNTER — Encounter: Payer: Self-pay | Admitting: Cardiology

## 2023-12-16 ENCOUNTER — Ambulatory Visit: Payer: BC Managed Care – PPO | Attending: Cardiology | Admitting: Cardiology

## 2023-12-16 VITALS — BP 120/76 | HR 73 | Ht 64.0 in | Wt 237.4 lb

## 2023-12-16 DIAGNOSIS — I10 Essential (primary) hypertension: Secondary | ICD-10-CM

## 2023-12-16 MED ORDER — AMLODIPINE BESYLATE 5 MG PO TABS: 5.0000 mg | ORAL_TABLET | Freq: Every day | ORAL | 3 refills | Status: AC

## 2023-12-16 NOTE — Progress Notes (Signed)
 Dr. Sheena and Rolin,   [name] has been identified as a patient that could benefit from health coaching for healthy eating and physical activity. Discuss with patient their interest in participating in the free health coaching program and refer to REF 2201/Care Navigation.   Pamela Ruth, MS, ERHD, [name]  Care Guide, Health & Wellness Coach [address] [place] [address] Telephone: [phone] Email: [name]

## 2023-12-16 NOTE — Patient Instructions (Addendum)
 Medication Instructions:  Your physician has recommended you make the following change in your medication:  STOP: Nifedipine  START: Amlodipine  (Norvasc ) 5 mg once daily *If you need a refill on your cardiac medications before your next appointment, please call your pharmacy*   Follow-Up: At [facility], you and your health needs are our priority.  As part of our continuing mission to provide you with exceptional heart care, we have created designated Provider Care Teams.  These Care Teams include your primary Cardiologist (physician) and Advanced Practice Providers (APPs -  Physician Assistants and Nurse Practitioners) who all work together to provide you with the care you need, when you need it.   Your next appointment:   1 year(s)  Provider:   Kardie Tobb, DO     Other Instructions:

## 2023-12-20 NOTE — Progress Notes (Signed)
 Cardio-Obstetrics Clinic  Follow Up Note   Date:  12/20/2023   ID:  [name], [name] 06-26-1986, MRN [id number]  PCP:  Gerome Brunet, DO   [facility] Providers Cardiologist:  Tomas Schamp, DO  Electrophysiologist:  None        Referring MD: Gerome Brunet, DO   Chief Complaint:  I am doing well  History of Present Illness:    [name] is a 38 y.o. female [[id number]] who returns for follow up of chronic hypertension.   She is her with the baby today.  She reports that she has been taking her blood pressure medication only once a day instead of the prescribed twice daily. She has had a discussion with her primary care doctor about her blood pressure readings, which were 110/68 on her last visit. Prior to her pregnancy, she had readings of 140/90 twice within a year. She denies any current symptoms related to her hypertension.   Prior CV Studies Reviewed: The following studies were reviewed today: Prior echo  Past Medical History:  Diagnosis Date   COVID 10/2019   mild - lost taste and smell for 3 weeks   H/O seasonal allergies    Hypertension    Migraine    uses Botox    Past Surgical History:  Procedure Laterality Date   CESAREAN SECTION N/A 04/30/2013   Procedure: CESAREAN SECTION;  Surgeon: Rome Rigg, MD;  Location: [facility];  Service: Obstetrics;  Laterality: N/A;   CESAREAN SECTION WITH BILATERAL TUBAL LIGATION N/A 05/05/2023   Procedure: CESAREAN SECTION WITH BILATERAL TUBAL LIGATION;  Surgeon: Sudie Lavonia HERO, MD;  Location: [facility];  Service: Obstetrics;  Laterality: N/A;   COLONOSCOPY     DILATION AND EVACUATION N/A 07/06/2022   Procedure: DILATATION AND EVACUATION WITH INTRAOPERATIVE ULTRASOUND, REPAIR OF LABIAL LACERATION;  Surgeon: Sudie Lavonia HERO, MD;  Location: [facility] OR;  Service: Gynecology;  Laterality: N/A;   UPPER GI ENDOSCOPY        OB History     Gravida  6   Para  2   Term  2   Preterm      AB  4   Living   2      SAB  3   IAB  1   Ectopic      Multiple  0   Live Births  2               Current Medications: Current Meds  Medication Sig   amLODipine  (NORVASC ) 5 MG tablet Take 1 tablet (5 mg total) by mouth daily.   Prenatal Vit-Fe Fumarate-FA (MULTIVITAMIN-PRENATAL) 27-0.8 MG TABS tablet Take 1 tablet by mouth in the morning.   [DISCONTINUED] NIFEdipine  (PROCARDIA  XL/NIFEDICAL XL) 60 MG 24 hr tablet Take 1 tablet (60 mg total) by mouth 2 (two) times daily.     Allergies:   Bactrim [sulfamethoxazole-trimethoprim] and Topamax [topiramate]   Social History   Socioeconomic History   Marital status: Single    Spouse name: Not on file   Number of children: 1   Years of education: Not on file   Highest education level: Not on file  Occupational History   Occupation: human resources  Tobacco Use   Smoking status: Never   Smokeless tobacco: Never  Vaping Use   Vaping status: Never Used  Substance and Sexual Activity   Alcohol use: Not Currently    Comment: occ   Drug use: No   Sexual activity: Yes  Birth control/protection: None  Other Topics Concern   Not on file  Social History Narrative   Lives with her child   Right handed   Caffeine : 1-2 cups/day   Social Drivers of Corporate Investment Banker Strain: Low Risk  (02/07/2022)   Received [facility]   Overall Financial Resource Strain (CARDIA)    Difficulty of Paying Living Expenses: Not hard at all  Food Insecurity: No Food Insecurity (05/05/2023)   Hunger Vital Sign    Worried About Running Out of Food in the Last Year: Never true    Ran Out of Food in the Last Year: Never true  Transportation Needs: Unmet Transportation Needs (05/05/2023)   PRAPARE - Transportation    Lack of Transportation (Medical): Yes    Lack of Transportation (Non-Medical): Yes  Physical Activity: Unknown (02/07/2022)   Received from [facility], [facility]   Exercise Vital Sign    Days of Exercise per  Week: Patient declined    Minutes of Exercise per Session: Not on file  Stress: No Stress Concern Present (02/07/2022)   Received from [facility] of Occupational Health - Occupational Stress Questionnaire    Feeling of Stress : Not at all  Social Connections: Unknown (02/23/2023)   Received from [facility], [facility]   Social Network    Social Network: Not on file      Family History  Problem Relation Age of Onset   Healthy Mother    Migraines Mother    Prostate cancer Father    Healthy Son    Colon cancer Paternal Grandmother    Migraines Maternal Grandmother    Heart disease Neg Hx    Hypertension Neg Hx       ROS:   Please see the history of present illness.     All other systems reviewed and are negative.   Labs/EKG Reviewed:    EKG:   EKG was ordered today.  The ekg ordered today demonstrates sinus rhythm  Recent Labs: 05/06/2023: Hemoglobin 8.7; Platelets 195   Recent Lipid Panel No results found for: CHOL, TRIG, HDL, CHOLHDL, LDLCALC, LDLDIRECT  Physical Exam:    VS:  BP 120/76 (BP Location: Right Arm, Patient Position: Sitting, Cuff Size: Normal)   Pulse 73   Ht 5' 4 (1.626 m)   Wt 237 lb 6.4 oz (107.7 kg)   BMI 40.75 kg/m     Wt Readings from Last 3 Encounters:  12/16/23 237 lb 6.4 oz (107.7 kg)  09/02/23 208 lb (94.3 kg)  05/24/23 222 lb 11.2 oz (101 kg)     GEN:  Well nourished, well developed in no acute distress HEENT: Normal NECK: No JVD; No carotid bruits LYMPHATICS: No lymphadenopathy CARDIAC: RRR, no murmurs, rubs, gallops RESPIRATORY:  Clear to auscultation without rales, wheezing or rhonchi  ABDOMEN: Soft, non-tender, non-distended MUSCULOSKELETAL:  No edema; No deformity  SKIN: Warm and dry NEUROLOGIC:  Alert and oriented x 3 PSYCHIATRIC:  Normal affect    Risk Assessment/Risk Calculators:                 ASSESSMENT & PLAN:    Hypertension Patient reports  inconsistent use of antihypertensive medication, resulting in lower blood pressure readings. Discussed the importance of consistent medication use and the potential long-term complications of untreated hypertension. -Switch from current antihypertensive medication to Amlodipine  5mg  daily. -Monitor for potential side effects, including swelling. -Check blood pressure regularly to ensure control.  Follow-up -Schedule annual follow-up appointment to monitor blood pressure and overall health. -Encourage patient to report any changes or side effects related to new medication.   Patient Instructions  Medication Instructions:  Your physician has recommended you make the following change in your medication:  STOP: Nifedipine  START: Amlodipine  (Norvasc ) 5 mg once daily *If you need a refill on your cardiac medications before your next appointment, please call your pharmacy*   Follow-Up: At [facility], you and your health needs are our priority.  As part of our continuing mission to provide you with exceptional heart care, we have created designated Provider Care Teams.  These Care Teams include your primary Cardiologist (physician) and Advanced Practice Providers (APPs -  Physician Assistants and Nurse Practitioners) who all work together to provide you with the care you need, when you need it.   Your next appointment:   1 year(s)  Provider:   Krisy Dix, DO     Other Instructions:     Dispo:  No follow-ups on file.   Medication Adjustments/Labs and Tests Ordered: Current medicines are reviewed at length with the patient today.  Concerns regarding medicines are outlined above.  Tests Ordered: Orders Placed This Encounter  Procedures   EKG 12-Lead   Medication Changes: Meds ordered this encounter  Medications   amLODipine  (NORVASC ) 5 MG tablet    Sig: Take 1 tablet (5 mg total) by mouth daily.    Dispense:  90 tablet    Refill:  3

## 2023-12-25 ENCOUNTER — Telehealth: Payer: Self-pay | Admitting: Cardiology

## 2023-12-25 ENCOUNTER — Encounter: Payer: Self-pay | Admitting: Cardiology

## 2023-12-25 NOTE — Telephone Encounter (Signed)
Patient identification verified by 2 forms. Marilynn Rail, RN    Received call from patient  Patient states:   -chest pain on going for 2 days   -initially went away last night   -has returned today mid day   -a constant chest pain on left side of heart   -pain is 5/10   -currently having chest pain at this time  Advised patient to present to ED for evaluation  Patient states she will try to present to [facility]  Informed patient best to present to ED due to active chest pain currently experiencing at time of call  Patient verbalized understanding

## 2023-12-25 NOTE — Telephone Encounter (Signed)
Please see documentation in 2/13 telephone encounter

## 2023-12-25 NOTE — Telephone Encounter (Signed)
   Pt c/o of Chest Pain: STAT if active CP, including tightness, pressure, jaw pain, radiating pain to shoulder/upper arm/back, CP unrelieved by Nitro. Symptoms reported of SOB, nausea, vomiting, sweating.  1. Are you having CP right now? Yes    2. Are you experiencing any other symptoms (ex. SOB, nausea, vomiting, sweating)? No    3. Is your CP continuous or coming and going? Continuous    4. Have you taken Nitroglycerin? No    5. How long have you been experiencing CP? Past two days     6. If NO CP at time of call then end call with telling Pt to call back or call 911 if Chest pain returns prior to return call from triage team.

## 2024-01-02 DIAGNOSIS — G5601 Carpal tunnel syndrome, right upper limb: Secondary | ICD-10-CM | POA: Diagnosis not present

## 2024-01-14 DIAGNOSIS — Z01419 Encounter for gynecological examination (general) (routine) without abnormal findings: Secondary | ICD-10-CM | POA: Diagnosis not present

## 2024-01-14 DIAGNOSIS — Z124 Encounter for screening for malignant neoplasm of cervix: Secondary | ICD-10-CM | POA: Diagnosis not present

## 2024-01-14 DIAGNOSIS — Z130 Encounter for screening for diseases of the blood and blood-forming organs and certain disorders involving the immune mechanism: Secondary | ICD-10-CM | POA: Diagnosis not present

## 2024-01-14 DIAGNOSIS — Z13228 Encounter for screening for other metabolic disorders: Secondary | ICD-10-CM | POA: Diagnosis not present

## 2024-01-14 DIAGNOSIS — Z13220 Encounter for screening for lipoid disorders: Secondary | ICD-10-CM | POA: Diagnosis not present

## 2024-01-15 DIAGNOSIS — F430 Acute stress reaction: Secondary | ICD-10-CM | POA: Diagnosis not present

## 2024-01-20 DIAGNOSIS — F418 Other specified anxiety disorders: Secondary | ICD-10-CM | POA: Diagnosis not present

## 2024-02-05 DIAGNOSIS — F418 Other specified anxiety disorders: Secondary | ICD-10-CM | POA: Diagnosis not present

## 2024-02-12 DIAGNOSIS — N760 Acute vaginitis: Secondary | ICD-10-CM | POA: Diagnosis not present

## 2024-02-12 DIAGNOSIS — B3731 Acute candidiasis of vulva and vagina: Secondary | ICD-10-CM | POA: Diagnosis not present

## 2024-02-12 DIAGNOSIS — A499 Bacterial infection, unspecified: Secondary | ICD-10-CM | POA: Diagnosis not present

## 2024-02-12 DIAGNOSIS — N898 Other specified noninflammatory disorders of vagina: Secondary | ICD-10-CM | POA: Diagnosis not present

## 2024-02-19 DIAGNOSIS — F418 Other specified anxiety disorders: Secondary | ICD-10-CM | POA: Diagnosis not present

## 2024-03-31 DIAGNOSIS — J3089 Other allergic rhinitis: Secondary | ICD-10-CM | POA: Diagnosis not present

## 2024-04-13 DIAGNOSIS — J301 Allergic rhinitis due to pollen: Secondary | ICD-10-CM | POA: Diagnosis not present

## 2024-04-14 DIAGNOSIS — J301 Allergic rhinitis due to pollen: Secondary | ICD-10-CM | POA: Diagnosis not present

## 2024-04-14 DIAGNOSIS — J3081 Allergic rhinitis due to animal (cat) (dog) hair and dander: Secondary | ICD-10-CM | POA: Diagnosis not present

## 2024-04-23 DIAGNOSIS — N760 Acute vaginitis: Secondary | ICD-10-CM | POA: Diagnosis not present

## 2024-05-13 DIAGNOSIS — R899 Unspecified abnormal finding in specimens from other organs, systems and tissues: Secondary | ICD-10-CM | POA: Diagnosis not present

## 2024-05-14 ENCOUNTER — Telehealth: Admitting: Nurse Practitioner

## 2024-05-14 DIAGNOSIS — J069 Acute upper respiratory infection, unspecified: Secondary | ICD-10-CM | POA: Diagnosis not present

## 2024-05-15 MED ORDER — IPRATROPIUM BROMIDE 0.03 % NA SOLN
2.0000 | Freq: Two times a day (BID) | NASAL | 0 refills | Status: AC
Start: 2024-05-15 — End: ?

## 2024-05-15 MED ORDER — IBUPROFEN 600 MG PO TABS
600.0000 mg | ORAL_TABLET | Freq: Three times a day (TID) | ORAL | 0 refills | Status: AC | PRN
Start: 2024-05-15 — End: ?

## 2024-05-15 MED ORDER — BENZONATATE 200 MG PO CAPS
200.0000 mg | ORAL_CAPSULE | Freq: Two times a day (BID) | ORAL | 0 refills | Status: AC | PRN
Start: 2024-05-15 — End: ?

## 2024-05-15 NOTE — Progress Notes (Signed)
 I have spent 5 minutes in review of e-visit questionnaire, review and updating patient chart, medical decision making and response to patient.   Claiborne Rigg, NP

## 2024-05-15 NOTE — Progress Notes (Signed)
 E-Visit for Upper Respiratory Infection   We are sorry you are not feeling well.  Here is how we plan to help!  Providers prescribe antibiotics to treat infections caused by bacteria. Antibiotics are very powerful in treating bacterial infections when they are used properly. To maintain their effectiveness, they should be used only when necessary. Overuse of antibiotics has resulted in the development of superbugs that are resistant to treatment!    After careful review of your answers, I would not recommend an antibiotic for your condition.  Antibiotics are not effective against viruses and therefore should not be used to treat them. Common examples of infections caused by viruses include colds and flu   Based on what you have shared with me, it looks like you may have a viral upper respiratory infection.  Upper respiratory infections are caused by a large number of viruses; however, rhinovirus is the most common cause.   Symptoms vary from person to person, with common symptoms including sore throat, cough, fatigue or lack of energy and feeling of general discomfort.  A low-grade fever of up to 100.4 may present, but is often uncommon.  Symptoms vary however, and are closely related to a person's age or underlying illnesses.  The most common symptoms associated with an upper respiratory infection are nasal discharge or congestion, cough, sneezing, headache and pressure in the ears and face.  These symptoms usually persist for about 3 to 10 days, but can last up to 2 weeks.  It is important to know that upper respiratory infections do not cause serious illness or complications in most cases.    Upper respiratory infections can be transmitted from person to person, with the most common method of transmission being a person's hands.  The virus is able to live on the skin and can infect other persons for up to 2 hours after direct contact.  Also, these can be transmitted when someone coughs or sneezes;  thus, it is important to cover the mouth to reduce this risk.  To keep the spread of the illness at bay, good hand hygiene is very important.  This is an infection that is most likely caused by a virus. There are no specific treatments other than to help you with the symptoms until the infection runs its course.  We are sorry you are not feeling well.  Here is how we plan to help!   For nasal congestion, you may use an oral decongestants such as Mucinex D or if you have glaucoma or high blood pressure use plain Mucinex.  Saline nasal spray or nasal drops can help and can safely be used as often as needed for congestion.  For your congestion, I have prescribed Ipratropium Bromide  nasal spray 0.03% two sprays in each nostril 2-3 times a day  If you do not have a history of heart disease, hypertension, diabetes or thyroid disease, prostate/bladder issues or glaucoma, you may also use Sudafed to treat nasal congestion.  It is highly recommended that you consult with a pharmacist or your primary care physician to ensure this medication is safe for you to take.     If you have a cough, you may use cough suppressants such as Delsym and Robitussin.  If you have glaucoma or high blood pressure, you can also use Coricidin HBP.   For cough I have prescribed for you A prescription cough medication called Tessalon  Perles 100 mg. You may take 1-2 capsules every 8 hours as needed for cough. For headache  I have prescribed motrin .   If you have a sore or scratchy throat, use a saltwater gargle-  to  teaspoon of salt dissolved in a 4-ounce to 8-ounce glass of warm water .  Gargle the solution for approximately 15-30 seconds and then spit.  It is important not to swallow the solution.  You can also use throat lozenges/cough drops and Chloraseptic spray to help with throat pain or discomfort.  Warm or cold liquids can also be helpful in relieving throat pain.  For headache, pain or general discomfort, you can use  Ibuprofen  or Tylenol  as directed.   Some authorities believe that zinc sprays or the use of Echinacea may shorten the course of your symptoms.   HOME CARE Only take medications as instructed by your medical team. Be sure to drink plenty of fluids. Water  is fine as well as fruit juices, sodas and electrolyte beverages. You may want to stay away from caffeine  or alcohol. If you are nauseated, try taking small sips of liquids. How do you know if you are getting enough fluid? Your urine should be a pale yellow or almost colorless. Get rest. Taking a steamy shower or using a humidifier may help nasal congestion and ease sore throat pain. You can place a towel over your head and breathe in the steam from hot water  coming from a faucet. Using a saline nasal spray works much the same way. Cough drops, hard candies and sore throat lozenges may ease your cough. Avoid close contacts especially the very young and the elderly Cover your mouth if you cough or sneeze Always remember to wash your hands.   GET HELP RIGHT AWAY IF: You develop worsening fever. If your symptoms do not improve within 10 days You develop yellow or green discharge from your nose over 3 days. You have coughing fits You develop a severe head ache or visual changes. You develop shortness of breath, difficulty breathing or start having chest pain Your symptoms persist after you have completed your treatment plan  MAKE SURE YOU  Understand these instructions. Will watch your condition. Will get help right away if you are not doing well or get worse.  Thank you for choosing an e-visit.  Your e-visit answers were reviewed by a board certified advanced clinical practitioner to complete your personal care plan. Depending upon the condition, your plan could have included both over the counter or prescription medications.  Please review your pharmacy choice. Make sure the pharmacy is open so you can pick up prescription now. If there  is a problem, you may contact your provider through [employer] and have the prescription routed to another pharmacy.  Your safety is important to us . If you have drug allergies check your prescription carefully.   For the next 24 hours you can use MyChart to ask questions about today's visit, request a non-urgent call back, or ask for a work or school excuse. You will get an email in the next two days asking about your experience. I hope that your e-visit has been valuable and will speed your recovery.

## 2024-05-17 DIAGNOSIS — J3081 Allergic rhinitis due to animal (cat) (dog) hair and dander: Secondary | ICD-10-CM | POA: Diagnosis not present

## 2024-05-17 DIAGNOSIS — J3089 Other allergic rhinitis: Secondary | ICD-10-CM | POA: Diagnosis not present

## 2024-05-17 DIAGNOSIS — J301 Allergic rhinitis due to pollen: Secondary | ICD-10-CM | POA: Diagnosis not present

## 2024-05-19 DIAGNOSIS — J301 Allergic rhinitis due to pollen: Secondary | ICD-10-CM | POA: Diagnosis not present

## 2024-05-19 DIAGNOSIS — J3081 Allergic rhinitis due to animal (cat) (dog) hair and dander: Secondary | ICD-10-CM | POA: Diagnosis not present

## 2024-05-19 DIAGNOSIS — J3089 Other allergic rhinitis: Secondary | ICD-10-CM | POA: Diagnosis not present

## 2024-05-21 DIAGNOSIS — G5601 Carpal tunnel syndrome, right upper limb: Secondary | ICD-10-CM | POA: Diagnosis not present

## 2024-05-21 DIAGNOSIS — M778 Other enthesopathies, not elsewhere classified: Secondary | ICD-10-CM | POA: Diagnosis not present

## 2024-05-21 DIAGNOSIS — M25531 Pain in right wrist: Secondary | ICD-10-CM | POA: Diagnosis not present

## 2024-05-25 DIAGNOSIS — M545 Low back pain, unspecified: Secondary | ICD-10-CM | POA: Diagnosis not present

## 2024-05-26 DIAGNOSIS — J3089 Other allergic rhinitis: Secondary | ICD-10-CM | POA: Diagnosis not present

## 2024-05-26 DIAGNOSIS — J301 Allergic rhinitis due to pollen: Secondary | ICD-10-CM | POA: Diagnosis not present

## 2024-05-26 DIAGNOSIS — J3081 Allergic rhinitis due to animal (cat) (dog) hair and dander: Secondary | ICD-10-CM | POA: Diagnosis not present

## 2024-06-01 DIAGNOSIS — J3089 Other allergic rhinitis: Secondary | ICD-10-CM | POA: Diagnosis not present

## 2024-06-01 DIAGNOSIS — J301 Allergic rhinitis due to pollen: Secondary | ICD-10-CM | POA: Diagnosis not present

## 2024-06-01 DIAGNOSIS — J3081 Allergic rhinitis due to animal (cat) (dog) hair and dander: Secondary | ICD-10-CM | POA: Diagnosis not present

## 2024-06-01 DIAGNOSIS — N760 Acute vaginitis: Secondary | ICD-10-CM | POA: Diagnosis not present

## 2024-06-04 DIAGNOSIS — J3081 Allergic rhinitis due to animal (cat) (dog) hair and dander: Secondary | ICD-10-CM | POA: Diagnosis not present

## 2024-06-04 DIAGNOSIS — J301 Allergic rhinitis due to pollen: Secondary | ICD-10-CM | POA: Diagnosis not present

## 2024-06-04 DIAGNOSIS — J3089 Other allergic rhinitis: Secondary | ICD-10-CM | POA: Diagnosis not present

## 2024-06-07 DIAGNOSIS — J3081 Allergic rhinitis due to animal (cat) (dog) hair and dander: Secondary | ICD-10-CM | POA: Diagnosis not present

## 2024-06-07 DIAGNOSIS — J3089 Other allergic rhinitis: Secondary | ICD-10-CM | POA: Diagnosis not present

## 2024-06-07 DIAGNOSIS — J301 Allergic rhinitis due to pollen: Secondary | ICD-10-CM | POA: Diagnosis not present

## 2024-06-09 DIAGNOSIS — Z13220 Encounter for screening for lipoid disorders: Secondary | ICD-10-CM | POA: Diagnosis not present

## 2024-06-09 DIAGNOSIS — M545 Low back pain, unspecified: Secondary | ICD-10-CM | POA: Diagnosis not present

## 2024-06-09 DIAGNOSIS — Z79899 Other long term (current) drug therapy: Secondary | ICD-10-CM | POA: Diagnosis not present

## 2024-06-09 DIAGNOSIS — R7309 Other abnormal glucose: Secondary | ICD-10-CM | POA: Diagnosis not present

## 2024-06-09 DIAGNOSIS — J3089 Other allergic rhinitis: Secondary | ICD-10-CM | POA: Diagnosis not present

## 2024-06-09 DIAGNOSIS — J301 Allergic rhinitis due to pollen: Secondary | ICD-10-CM | POA: Diagnosis not present

## 2024-06-09 DIAGNOSIS — J3081 Allergic rhinitis due to animal (cat) (dog) hair and dander: Secondary | ICD-10-CM | POA: Diagnosis not present

## 2024-06-09 DIAGNOSIS — R7989 Other specified abnormal findings of blood chemistry: Secondary | ICD-10-CM | POA: Diagnosis not present

## 2024-06-11 DIAGNOSIS — J3089 Other allergic rhinitis: Secondary | ICD-10-CM | POA: Diagnosis not present

## 2024-06-11 DIAGNOSIS — J3081 Allergic rhinitis due to animal (cat) (dog) hair and dander: Secondary | ICD-10-CM | POA: Diagnosis not present

## 2024-06-11 DIAGNOSIS — J301 Allergic rhinitis due to pollen: Secondary | ICD-10-CM | POA: Diagnosis not present

## 2024-06-14 DIAGNOSIS — J3089 Other allergic rhinitis: Secondary | ICD-10-CM | POA: Diagnosis not present

## 2024-06-14 DIAGNOSIS — J3081 Allergic rhinitis due to animal (cat) (dog) hair and dander: Secondary | ICD-10-CM | POA: Diagnosis not present

## 2024-06-14 DIAGNOSIS — J301 Allergic rhinitis due to pollen: Secondary | ICD-10-CM | POA: Diagnosis not present

## 2024-06-16 DIAGNOSIS — J301 Allergic rhinitis due to pollen: Secondary | ICD-10-CM | POA: Diagnosis not present

## 2024-06-16 DIAGNOSIS — J3089 Other allergic rhinitis: Secondary | ICD-10-CM | POA: Diagnosis not present

## 2024-06-16 DIAGNOSIS — J3081 Allergic rhinitis due to animal (cat) (dog) hair and dander: Secondary | ICD-10-CM | POA: Diagnosis not present

## 2024-06-17 DIAGNOSIS — Z0000 Encounter for general adult medical examination without abnormal findings: Secondary | ICD-10-CM | POA: Diagnosis not present

## 2024-06-17 DIAGNOSIS — Z111 Encounter for screening for respiratory tuberculosis: Secondary | ICD-10-CM | POA: Diagnosis not present

## 2024-06-17 DIAGNOSIS — Z0184 Encounter for antibody response examination: Secondary | ICD-10-CM | POA: Diagnosis not present

## 2024-06-24 DIAGNOSIS — J3089 Other allergic rhinitis: Secondary | ICD-10-CM | POA: Diagnosis not present

## 2024-06-24 DIAGNOSIS — J3081 Allergic rhinitis due to animal (cat) (dog) hair and dander: Secondary | ICD-10-CM | POA: Diagnosis not present

## 2024-06-24 DIAGNOSIS — J301 Allergic rhinitis due to pollen: Secondary | ICD-10-CM | POA: Diagnosis not present

## 2024-07-02 DIAGNOSIS — J3089 Other allergic rhinitis: Secondary | ICD-10-CM | POA: Diagnosis not present

## 2024-07-02 DIAGNOSIS — J301 Allergic rhinitis due to pollen: Secondary | ICD-10-CM | POA: Diagnosis not present

## 2024-07-02 DIAGNOSIS — J3081 Allergic rhinitis due to animal (cat) (dog) hair and dander: Secondary | ICD-10-CM | POA: Diagnosis not present

## 2024-07-05 DIAGNOSIS — J301 Allergic rhinitis due to pollen: Secondary | ICD-10-CM | POA: Diagnosis not present

## 2024-07-05 DIAGNOSIS — J3081 Allergic rhinitis due to animal (cat) (dog) hair and dander: Secondary | ICD-10-CM | POA: Diagnosis not present

## 2024-07-05 DIAGNOSIS — J3089 Other allergic rhinitis: Secondary | ICD-10-CM | POA: Diagnosis not present

## 2024-07-09 DIAGNOSIS — J3089 Other allergic rhinitis: Secondary | ICD-10-CM | POA: Diagnosis not present

## 2024-07-09 DIAGNOSIS — J301 Allergic rhinitis due to pollen: Secondary | ICD-10-CM | POA: Diagnosis not present

## 2024-07-09 DIAGNOSIS — J3081 Allergic rhinitis due to animal (cat) (dog) hair and dander: Secondary | ICD-10-CM | POA: Diagnosis not present

## 2024-07-13 DIAGNOSIS — J301 Allergic rhinitis due to pollen: Secondary | ICD-10-CM | POA: Diagnosis not present

## 2024-07-13 DIAGNOSIS — J3089 Other allergic rhinitis: Secondary | ICD-10-CM | POA: Diagnosis not present

## 2024-07-13 DIAGNOSIS — J3081 Allergic rhinitis due to animal (cat) (dog) hair and dander: Secondary | ICD-10-CM | POA: Diagnosis not present

## 2024-07-16 DIAGNOSIS — J301 Allergic rhinitis due to pollen: Secondary | ICD-10-CM | POA: Diagnosis not present

## 2024-07-16 DIAGNOSIS — J3081 Allergic rhinitis due to animal (cat) (dog) hair and dander: Secondary | ICD-10-CM | POA: Diagnosis not present

## 2024-07-16 DIAGNOSIS — J3089 Other allergic rhinitis: Secondary | ICD-10-CM | POA: Diagnosis not present

## 2024-07-20 DIAGNOSIS — J3081 Allergic rhinitis due to animal (cat) (dog) hair and dander: Secondary | ICD-10-CM | POA: Diagnosis not present

## 2024-07-20 DIAGNOSIS — J3089 Other allergic rhinitis: Secondary | ICD-10-CM | POA: Diagnosis not present

## 2024-07-20 DIAGNOSIS — J301 Allergic rhinitis due to pollen: Secondary | ICD-10-CM | POA: Diagnosis not present

## 2024-07-22 ENCOUNTER — Telehealth: Admitting: Physician Assistant

## 2024-07-22 DIAGNOSIS — B9689 Other specified bacterial agents as the cause of diseases classified elsewhere: Secondary | ICD-10-CM | POA: Diagnosis not present

## 2024-07-22 DIAGNOSIS — N760 Acute vaginitis: Secondary | ICD-10-CM | POA: Diagnosis not present

## 2024-07-22 MED ORDER — METRONIDAZOLE 500 MG PO TABS
500.0000 mg | ORAL_TABLET | Freq: Two times a day (BID) | ORAL | 0 refills | Status: AC
Start: 2024-07-22 — End: 2024-07-29

## 2024-07-22 NOTE — Progress Notes (Signed)
 E-Visit for Vaginal Symptoms  We are sorry that you are not feeling well. Here is how we plan to help! Based on what you shared with me it looks like you: May have a vaginosis due to bacteria  Vaginosis is an inflammation of the vagina that can result in discharge, itching and pain. The cause is usually a change in the normal balance of vaginal bacteria or an infection. Vaginosis can also result from reduced estrogen levels after menopause.  The most common causes of vaginosis are:   Bacterial vaginosis which results from an overgrowth of one on several organisms that are normally present in your vagina.   Yeast infections which are caused by a naturally occurring fungus called candida.   Vaginal atrophy (atrophic vaginosis) which results from the thinning of the vagina from reduced estrogen levels after menopause.   Trichomoniasis which is caused by a parasite and is commonly transmitted by sexual intercourse.  Factors that increase your risk of developing vaginosis include: Medications, such as antibiotics and steroids Uncontrolled diabetes Use of hygiene products such as bubble bath, vaginal spray or vaginal deodorant Douching Wearing damp or tight-fitting clothing Using an intrauterine device (IUD) for birth control Hormonal changes, such as those associated with pregnancy, birth control pills or menopause Sexual activity Having a sexually transmitted infection  Your treatment plan is Metronidazole or Flagyl 500mg  twice a day for 7 days.  I have electronically sent this prescription into the pharmacy that you have chosen.  Be sure to take all of the medication as directed. Stop taking any medication if you develop a rash, tongue swelling or shortness of breath. Mothers who are breast feeding should consider pumping and discarding their breast milk while on these antibiotics. However, there is no consensus that infant exposure at these doses would be harmful.  Remember that  medication creams can weaken latex condoms. [name]   HOME CARE:  Good hygiene may prevent some types of vaginosis from recurring and may relieve some symptoms:  Avoid baths, hot tubs and whirlpool spas. Rinse soap from your outer genital area after a shower, and dry the area well to prevent irritation. Don't use scented or harsh soaps, such as those with deodorant or antibacterial action. Avoid irritants. These include scented tampons and pads. Wipe from front to back after using the toilet. Doing so avoids spreading fecal bacteria to your vagina.  Other things that may help prevent vaginosis include:  Don't douche. Your vagina doesn't require cleansing other than normal bathing. Repetitive douching disrupts the normal organisms that reside in the vagina and can actually increase your risk of vaginal infection. Douching won't clear up a vaginal infection. Use a latex condom. Both female and female latex condoms may help you avoid infections spread by sexual contact. Wear cotton underwear. Also wear pantyhose with a cotton crotch. If you feel comfortable without it, skip wearing underwear to bed. Yeast thrives in [employer] Your symptoms should improve in the next day or two.  GET HELP RIGHT AWAY IF:  You have pain in your lower abdomen ( pelvic area or over your ovaries) You develop nausea or vomiting You develop a fever Your discharge changes or worsens You have persistent pain with intercourse You develop shortness of breath, a rapid pulse, or you faint.  These symptoms could be signs of problems or infections that need to be evaluated by a medical provider now.  MAKE SURE YOU   Understand these instructions. Will watch your condition. Will get help right  away if you are not doing well or get worse.  Thank you for choosing an e-visit.  Your e-visit answers were reviewed by a board certified advanced clinical practitioner to complete your personal care plan. Depending upon the  condition, your plan could have included both over the counter or prescription medications.  Please review your pharmacy choice. Make sure the pharmacy is open so you can pick up prescription now. If there is a problem, you may contact your provider through [employer] and have the prescription routed to another pharmacy.  Your safety is important to us . If you have drug allergies check your prescription carefully.   For the next 24 hours you can use MyChart to ask questions about today's visit, request a non-urgent call back, or ask for a work or school excuse. You will get an email in the next two days asking about your experience. I hope that your e-visit has been valuable and will speed your recovery.  I have spent 5 minutes in review of e-visit questionnaire, review and updating patient chart, medical decision making and response to patient.   [name] Pamela Dickinson, PA-C

## 2024-07-30 DIAGNOSIS — J301 Allergic rhinitis due to pollen: Secondary | ICD-10-CM | POA: Diagnosis not present

## 2024-07-30 DIAGNOSIS — J3089 Other allergic rhinitis: Secondary | ICD-10-CM | POA: Diagnosis not present

## 2024-07-30 DIAGNOSIS — J3081 Allergic rhinitis due to animal (cat) (dog) hair and dander: Secondary | ICD-10-CM | POA: Diagnosis not present

## 2024-08-09 DIAGNOSIS — J301 Allergic rhinitis due to pollen: Secondary | ICD-10-CM | POA: Diagnosis not present

## 2024-08-09 DIAGNOSIS — J3089 Other allergic rhinitis: Secondary | ICD-10-CM | POA: Diagnosis not present

## 2024-08-09 DIAGNOSIS — J3081 Allergic rhinitis due to animal (cat) (dog) hair and dander: Secondary | ICD-10-CM | POA: Diagnosis not present

## 2024-08-11 DIAGNOSIS — J3089 Other allergic rhinitis: Secondary | ICD-10-CM | POA: Diagnosis not present

## 2024-08-11 DIAGNOSIS — J3081 Allergic rhinitis due to animal (cat) (dog) hair and dander: Secondary | ICD-10-CM | POA: Diagnosis not present

## 2024-08-11 DIAGNOSIS — J301 Allergic rhinitis due to pollen: Secondary | ICD-10-CM | POA: Diagnosis not present

## 2024-08-18 DIAGNOSIS — J301 Allergic rhinitis due to pollen: Secondary | ICD-10-CM | POA: Diagnosis not present

## 2024-08-18 DIAGNOSIS — J3089 Other allergic rhinitis: Secondary | ICD-10-CM | POA: Diagnosis not present

## 2024-08-18 DIAGNOSIS — J3081 Allergic rhinitis due to animal (cat) (dog) hair and dander: Secondary | ICD-10-CM | POA: Diagnosis not present

## 2024-09-01 DIAGNOSIS — J3089 Other allergic rhinitis: Secondary | ICD-10-CM | POA: Diagnosis not present

## 2024-09-01 DIAGNOSIS — J3081 Allergic rhinitis due to animal (cat) (dog) hair and dander: Secondary | ICD-10-CM | POA: Diagnosis not present

## 2024-09-01 DIAGNOSIS — J301 Allergic rhinitis due to pollen: Secondary | ICD-10-CM | POA: Diagnosis not present

## 2024-09-03 DIAGNOSIS — R634 Abnormal weight loss: Secondary | ICD-10-CM | POA: Diagnosis not present

## 2024-09-03 DIAGNOSIS — I10 Essential (primary) hypertension: Secondary | ICD-10-CM | POA: Diagnosis not present

## 2024-09-03 DIAGNOSIS — L659 Nonscarring hair loss, unspecified: Secondary | ICD-10-CM | POA: Diagnosis not present

## 2024-09-10 DIAGNOSIS — J3081 Allergic rhinitis due to animal (cat) (dog) hair and dander: Secondary | ICD-10-CM | POA: Diagnosis not present

## 2024-09-10 DIAGNOSIS — J3089 Other allergic rhinitis: Secondary | ICD-10-CM | POA: Diagnosis not present

## 2024-09-10 DIAGNOSIS — J301 Allergic rhinitis due to pollen: Secondary | ICD-10-CM | POA: Diagnosis not present

## 2024-09-15 DIAGNOSIS — J3081 Allergic rhinitis due to animal (cat) (dog) hair and dander: Secondary | ICD-10-CM | POA: Diagnosis not present

## 2024-09-15 DIAGNOSIS — J3089 Other allergic rhinitis: Secondary | ICD-10-CM | POA: Diagnosis not present

## 2024-09-15 DIAGNOSIS — J301 Allergic rhinitis due to pollen: Secondary | ICD-10-CM | POA: Diagnosis not present

## 2024-09-22 DIAGNOSIS — J3081 Allergic rhinitis due to animal (cat) (dog) hair and dander: Secondary | ICD-10-CM | POA: Diagnosis not present

## 2024-09-22 DIAGNOSIS — J3089 Other allergic rhinitis: Secondary | ICD-10-CM | POA: Diagnosis not present

## 2024-09-22 DIAGNOSIS — J301 Allergic rhinitis due to pollen: Secondary | ICD-10-CM | POA: Diagnosis not present

## 2024-09-29 DIAGNOSIS — J3081 Allergic rhinitis due to animal (cat) (dog) hair and dander: Secondary | ICD-10-CM | POA: Diagnosis not present

## 2024-09-29 DIAGNOSIS — J3089 Other allergic rhinitis: Secondary | ICD-10-CM | POA: Diagnosis not present

## 2024-09-29 DIAGNOSIS — J301 Allergic rhinitis due to pollen: Secondary | ICD-10-CM | POA: Diagnosis not present

## 2024-10-28 ENCOUNTER — Telehealth: Payer: Self-pay | Admitting: *Deleted

## 2024-10-28 DIAGNOSIS — G43009 Migraine without aura, not intractable, without status migrainosus: Secondary | ICD-10-CM | POA: Diagnosis not present

## 2024-10-28 NOTE — Telephone Encounter (Signed)
 Let message pt need to sign a release form.

## 2024-11-01 DIAGNOSIS — J301 Allergic rhinitis due to pollen: Secondary | ICD-10-CM | POA: Diagnosis not present

## 2024-11-01 DIAGNOSIS — J3089 Other allergic rhinitis: Secondary | ICD-10-CM | POA: Diagnosis not present

## 2024-11-01 DIAGNOSIS — J3081 Allergic rhinitis due to animal (cat) (dog) hair and dander: Secondary | ICD-10-CM | POA: Diagnosis not present

## 2024-11-05 DIAGNOSIS — J301 Allergic rhinitis due to pollen: Secondary | ICD-10-CM | POA: Diagnosis not present

## 2024-11-05 DIAGNOSIS — J3089 Other allergic rhinitis: Secondary | ICD-10-CM | POA: Diagnosis not present

## 2024-11-05 DIAGNOSIS — J3081 Allergic rhinitis due to animal (cat) (dog) hair and dander: Secondary | ICD-10-CM | POA: Diagnosis not present

## 2024-11-07 ENCOUNTER — Telehealth: Admitting: Family

## 2024-11-07 DIAGNOSIS — J019 Acute sinusitis, unspecified: Secondary | ICD-10-CM | POA: Diagnosis not present

## 2024-11-07 MED ORDER — AMOXICILLIN-POT CLAVULANATE 875-125 MG PO TABS: 1.0000 | ORAL_TABLET | Freq: Two times a day (BID) | ORAL | 0 refills | Status: AC

## 2024-11-07 NOTE — Progress Notes (Signed)

## 2024-11-09 DIAGNOSIS — H1045 Other chronic allergic conjunctivitis: Secondary | ICD-10-CM | POA: Diagnosis not present

## 2024-11-09 DIAGNOSIS — J3081 Allergic rhinitis due to animal (cat) (dog) hair and dander: Secondary | ICD-10-CM | POA: Diagnosis not present

## 2024-11-09 DIAGNOSIS — J301 Allergic rhinitis due to pollen: Secondary | ICD-10-CM | POA: Diagnosis not present

## 2024-11-09 DIAGNOSIS — J3089 Other allergic rhinitis: Secondary | ICD-10-CM | POA: Diagnosis not present
